# Patient Record
Sex: Female | Born: 1945
Health system: Southern US, Community
[De-identification: ages and names within clinical notes are randomized; demographics above are authoritative.]

## PROBLEM LIST (undated history)

## (undated) DIAGNOSIS — K219 Gastro-esophageal reflux disease without esophagitis: Secondary | ICD-10-CM

## (undated) DIAGNOSIS — C55 Malignant neoplasm of uterus, part unspecified: Secondary | ICD-10-CM

## (undated) DIAGNOSIS — I10 Essential (primary) hypertension: Secondary | ICD-10-CM

## (undated) DIAGNOSIS — E119 Type 2 diabetes mellitus without complications: Secondary | ICD-10-CM

## (undated) DIAGNOSIS — E7800 Pure hypercholesterolemia, unspecified: Secondary | ICD-10-CM

## (undated) DIAGNOSIS — I480 Paroxysmal atrial fibrillation: Secondary | ICD-10-CM

## (undated) HISTORY — PX: ABDOMINAL HYSTERECTOMY: SHX81

## (undated) HISTORY — DX: Type 2 diabetes mellitus without complications: E11.9

## (undated) HISTORY — DX: Paroxysmal atrial fibrillation: I48.0

## (undated) HISTORY — DX: Malignant neoplasm of uterus, part unspecified: C55

## (undated) HISTORY — PX: OTHER SURGICAL HISTORY: SHX169

---

## 2001-03-26 ENCOUNTER — Ambulatory Visit (HOSPITAL_COMMUNITY): Admission: RE | Admit: 2001-03-26 | Discharge: 2001-03-26 | Payer: Self-pay | Admitting: Internal Medicine

## 2001-03-26 ENCOUNTER — Encounter: Payer: Self-pay | Admitting: Obstetrics and Gynecology

## 2001-07-03 ENCOUNTER — Other Ambulatory Visit: Admission: RE | Admit: 2001-07-03 | Discharge: 2001-07-03 | Payer: Self-pay | Admitting: Obstetrics and Gynecology

## 2002-02-14 ENCOUNTER — Other Ambulatory Visit: Admission: RE | Admit: 2002-02-14 | Discharge: 2002-02-14 | Payer: Self-pay | Admitting: Dermatology

## 2002-03-29 ENCOUNTER — Ambulatory Visit (HOSPITAL_COMMUNITY): Admission: RE | Admit: 2002-03-29 | Discharge: 2002-03-29 | Payer: Self-pay | Admitting: Obstetrics and Gynecology

## 2002-03-29 ENCOUNTER — Encounter: Payer: Self-pay | Admitting: Obstetrics and Gynecology

## 2002-08-27 ENCOUNTER — Other Ambulatory Visit: Admission: RE | Admit: 2002-08-27 | Discharge: 2002-08-27 | Payer: Self-pay | Admitting: Obstetrics & Gynecology

## 2002-10-08 ENCOUNTER — Ambulatory Visit (HOSPITAL_COMMUNITY): Admission: RE | Admit: 2002-10-08 | Discharge: 2002-10-08 | Payer: Self-pay | Admitting: Obstetrics & Gynecology

## 2003-07-08 ENCOUNTER — Other Ambulatory Visit: Admission: RE | Admit: 2003-07-08 | Discharge: 2003-07-08 | Payer: Self-pay | Admitting: Obstetrics & Gynecology

## 2003-07-10 ENCOUNTER — Ambulatory Visit (HOSPITAL_COMMUNITY): Admission: RE | Admit: 2003-07-10 | Discharge: 2003-07-10 | Payer: Self-pay | Admitting: Internal Medicine

## 2003-10-11 HISTORY — PX: COLONOSCOPY: SHX174

## 2004-08-16 ENCOUNTER — Other Ambulatory Visit: Admission: RE | Admit: 2004-08-16 | Discharge: 2004-08-16 | Payer: Self-pay | Admitting: Obstetrics & Gynecology

## 2005-09-14 ENCOUNTER — Other Ambulatory Visit: Admission: RE | Admit: 2005-09-14 | Discharge: 2005-09-14 | Payer: Self-pay | Admitting: Obstetrics & Gynecology

## 2013-07-23 DIAGNOSIS — Z23 Encounter for immunization: Secondary | ICD-10-CM | POA: Diagnosis not present

## 2013-08-19 DIAGNOSIS — M1990 Unspecified osteoarthritis, unspecified site: Secondary | ICD-10-CM | POA: Diagnosis not present

## 2013-08-19 DIAGNOSIS — I10 Essential (primary) hypertension: Secondary | ICD-10-CM | POA: Diagnosis not present

## 2013-08-19 DIAGNOSIS — Z79899 Other long term (current) drug therapy: Secondary | ICD-10-CM | POA: Diagnosis not present

## 2013-08-19 DIAGNOSIS — K219 Gastro-esophageal reflux disease without esophagitis: Secondary | ICD-10-CM | POA: Diagnosis not present

## 2013-08-19 DIAGNOSIS — E119 Type 2 diabetes mellitus without complications: Secondary | ICD-10-CM | POA: Diagnosis not present

## 2013-08-27 DIAGNOSIS — Z0000 Encounter for general adult medical examination without abnormal findings: Secondary | ICD-10-CM | POA: Diagnosis not present

## 2013-08-27 DIAGNOSIS — E785 Hyperlipidemia, unspecified: Secondary | ICD-10-CM | POA: Diagnosis not present

## 2013-08-27 DIAGNOSIS — I10 Essential (primary) hypertension: Secondary | ICD-10-CM | POA: Diagnosis not present

## 2013-08-27 DIAGNOSIS — K219 Gastro-esophageal reflux disease without esophagitis: Secondary | ICD-10-CM | POA: Diagnosis not present

## 2013-08-27 DIAGNOSIS — Z23 Encounter for immunization: Secondary | ICD-10-CM | POA: Diagnosis not present

## 2013-08-27 DIAGNOSIS — E119 Type 2 diabetes mellitus without complications: Secondary | ICD-10-CM | POA: Diagnosis not present

## 2013-09-12 DIAGNOSIS — L820 Inflamed seborrheic keratosis: Secondary | ICD-10-CM | POA: Diagnosis not present

## 2013-09-12 DIAGNOSIS — D235 Other benign neoplasm of skin of trunk: Secondary | ICD-10-CM | POA: Diagnosis not present

## 2013-09-17 ENCOUNTER — Encounter (INDEPENDENT_AMBULATORY_CARE_PROVIDER_SITE_OTHER): Payer: Self-pay | Admitting: *Deleted

## 2013-09-26 ENCOUNTER — Encounter (INDEPENDENT_AMBULATORY_CARE_PROVIDER_SITE_OTHER): Payer: Self-pay | Admitting: *Deleted

## 2013-09-26 ENCOUNTER — Telehealth (INDEPENDENT_AMBULATORY_CARE_PROVIDER_SITE_OTHER): Payer: Self-pay | Admitting: *Deleted

## 2013-09-26 ENCOUNTER — Other Ambulatory Visit (INDEPENDENT_AMBULATORY_CARE_PROVIDER_SITE_OTHER): Payer: Self-pay | Admitting: *Deleted

## 2013-09-26 DIAGNOSIS — Z1211 Encounter for screening for malignant neoplasm of colon: Secondary | ICD-10-CM

## 2013-09-26 NOTE — Telephone Encounter (Signed)
Patient needs osmo pill prep 

## 2013-09-27 MED ORDER — SOD PHOS MONO-SOD PHOS DIBASIC 1.102-0.398 G PO TABS: ORAL_TABLET | ORAL | Status: DC

## 2013-10-29 ENCOUNTER — Telehealth (INDEPENDENT_AMBULATORY_CARE_PROVIDER_SITE_OTHER): Payer: Self-pay | Admitting: *Deleted

## 2013-10-29 NOTE — Telephone Encounter (Signed)
  Procedure: tcs  Reason/Indication:  screening  Has patient had this procedure before?  Yes, 2004  If so, when, by whom and where?    Is there a family history of colon cancer?  no  Who?  What age when diagnosed?    Is patient diabetic?   no      Does patient have prosthetic heart valve?  no  Do you have a pacemaker?  no  Has patient ever had endocarditis? no  Has patient had joint replacement within last 12 months?  no  Does patient tend to be constipated or take laxatives? no  Is patient on Coumadin, Plavix and/or Aspirin? yes  Medications: asa 81 mg daily, losartan/hctz 100/25 mg daily, amlodipine 5 mg daily, omeprazole 20 mg daily, pravastatin 20 mg daily  Allergies: nkda  Medication Adjustment: asa 2 days  Procedure date & time: 11/28/13 at 830

## 2013-10-29 NOTE — Telephone Encounter (Signed)
agree

## 2013-11-14 ENCOUNTER — Encounter (HOSPITAL_COMMUNITY): Payer: Self-pay | Admitting: Pharmacy Technician

## 2013-11-28 ENCOUNTER — Encounter (HOSPITAL_COMMUNITY): Payer: Self-pay | Admitting: *Deleted

## 2013-11-28 ENCOUNTER — Encounter (HOSPITAL_COMMUNITY)
Admission: RE | Disposition: A | Discharge status: Home / Self Care | Payer: Self-pay | Source: Ambulatory Visit | Attending: Internal Medicine

## 2013-11-28 ENCOUNTER — Ambulatory Visit (HOSPITAL_COMMUNITY)
Admission: RE | Admit: 2013-11-28 | Discharge: 2013-11-28 | Disposition: A | Discharge status: Home / Self Care | Payer: Medicare Other | Source: Ambulatory Visit | Attending: Internal Medicine | Admitting: Internal Medicine

## 2013-11-28 DIAGNOSIS — E7800 Pure hypercholesterolemia, unspecified: Secondary | ICD-10-CM | POA: Diagnosis not present

## 2013-11-28 DIAGNOSIS — Z79899 Other long term (current) drug therapy: Secondary | ICD-10-CM | POA: Insufficient documentation

## 2013-11-28 DIAGNOSIS — Z7982 Long term (current) use of aspirin: Secondary | ICD-10-CM | POA: Insufficient documentation

## 2013-11-28 DIAGNOSIS — I10 Essential (primary) hypertension: Secondary | ICD-10-CM | POA: Diagnosis not present

## 2013-11-28 DIAGNOSIS — K644 Residual hemorrhoidal skin tags: Secondary | ICD-10-CM | POA: Diagnosis not present

## 2013-11-28 DIAGNOSIS — Z1211 Encounter for screening for malignant neoplasm of colon: Secondary | ICD-10-CM

## 2013-11-28 HISTORY — DX: Essential (primary) hypertension: I10

## 2013-11-28 HISTORY — DX: Pure hypercholesterolemia, unspecified: E78.00

## 2013-11-28 HISTORY — PX: COLONOSCOPY: SHX5424

## 2013-11-28 HISTORY — DX: Gastro-esophageal reflux disease without esophagitis: K21.9

## 2013-11-28 SURGERY — COLONOSCOPY
Anesthesia: Moderate Sedation

## 2013-11-28 MED ORDER — MIDAZOLAM HCL 5 MG/5ML IJ SOLN
INTRAMUSCULAR | Status: AC
  Filled 2013-11-28: qty 10

## 2013-11-28 MED ORDER — MEPERIDINE HCL 50 MG/ML IJ SOLN
INTRAMUSCULAR | Status: DC | PRN
  Administered 2013-11-28 (×2): 25 mg via INTRAVENOUS

## 2013-11-28 MED ORDER — STERILE WATER FOR IRRIGATION IR SOLN
Status: DC | PRN
  Administered 2013-11-28: 09:00:00

## 2013-11-28 MED ORDER — MIDAZOLAM HCL 5 MG/5ML IJ SOLN
INTRAMUSCULAR | Status: DC | PRN
  Administered 2013-11-28 (×3): 2 mg via INTRAVENOUS

## 2013-11-28 MED ORDER — SODIUM CHLORIDE 0.9 % IV SOLN
INTRAVENOUS | Status: DC
  Administered 2013-11-28: 08:00:00 via INTRAVENOUS

## 2013-11-28 MED ORDER — MEPERIDINE HCL 50 MG/ML IJ SOLN
INTRAMUSCULAR | Status: AC
  Filled 2013-11-28: qty 1

## 2013-11-28 NOTE — Op Note (Signed)
COLONOSCOPY PROCEDURE REPORT  PATIENT:  Jennifer Morrison  MR#:  [id number] Birthdate:  1946-03-04, 68 y.o., female Endoscopist:  Dr. Rogene Houston, MD Referred By:  Dr. Asencion Noble, MD  Procedure Date: 11/28/2013  Procedure:   Colonoscopy  Indications:  Patient 68 year old Caucasian female was undergoing average risk screening colonoscopy.  Informed Consent:  The procedure and risks were reviewed with the patient and informed consent was obtained.  Medications:  Demerol 50 mg IV Versed 6 mg IV  Description of procedure:  After a digital rectal exam was performed, that colonoscope was advanced from the anus through the rectum and colon to the area of the cecum, ileocecal valve and appendiceal orifice. The cecum was deeply intubated. These structures were well-seen and photographed for the record. From the level of the cecum and ileocecal valve, the scope was slowly and cautiously withdrawn. The mucosal surfaces were carefully surveyed utilizing scope tip to flexion to facilitate fold flattening as needed. The scope was pulled down into the rectum where a thorough exam including retroflexion was performed.  Findings:   Prep satisfactory. Normal mucosa cecum and the rest of the colon. Normal mucosa of rectum except distal scar. Small hemorrhoids below the dentate.   Therapeutic/Diagnostic Maneuvers Performed:   None  Complications:  None  Cecal Withdrawal Time:  9 minutes  Impression:  Normal colonoscopy except small external hemorrhoids.  Recommendations:  Standard instructions given. Next screening exam in 10 years.  Susie Pousson U  11/28/2013 9:01 AM  CC: Dr. Asencion Noble, MD & Dr. Rayne Du ref. provider found

## 2013-11-28 NOTE — Discharge Instructions (Signed)
Resume usual medications and diet. °No driving for 24 hours. °Next screening exam in 10 years. ° °Colonoscopy, Care After °Refer to this sheet in the next few weeks. These instructions provide you with information on caring for yourself after your procedure. Your health care provider may also give you more specific instructions. Your treatment has been planned according to current medical practices, but problems sometimes occur. Call your health care provider if you have any problems or questions after your procedure. °WHAT TO EXPECT AFTER THE PROCEDURE  °After your procedure, it is typical to have the following: °· A small amount of blood in your stool. °· Moderate amounts of gas and mild abdominal cramping or bloating. °HOME CARE INSTRUCTIONS °· Do not drive, operate machinery, or sign important documents for 24 hours. °· You may shower and resume your regular physical activities, but move at a slower pace for the first 24 hours. °· Take frequent rest periods for the first 24 hours. °· Walk around or put a warm pack on your abdomen to help reduce abdominal cramping and bloating. °· Drink enough fluids to keep your urine clear or pale yellow. °· You may resume your normal diet as instructed by your health care provider. Avoid heavy or fried foods that are hard to digest. °· Avoid drinking alcohol for 24 hours or as instructed by your health care provider. °· Only take over-the-counter or prescription medicines as directed by your health care provider. °· If a tissue sample (biopsy) was taken during your procedure: °¨ Do not take aspirin or blood thinners for 7 days, or as instructed by your health care provider. °¨ Do not drink alcohol for 7 days, or as instructed by your health care provider. °¨ Eat soft foods for the first 24 hours. °SEEK MEDICAL CARE IF: °You have persistent spotting of blood in your stool 2-3 days after the procedure. °SEEK IMMEDIATE MEDICAL CARE IF: °· You have more than a small spotting of  blood in your stool. °· You pass large blood clots in your stool. °· Your abdomen is swollen (distended). °· You have nausea or vomiting. °· You have a fever. °· You have increasing abdominal pain that is not relieved with medicine. ° °

## 2013-11-28 NOTE — H&P (Signed)
Jennifer Morrison is an 68 y.o. female.   Chief Complaint: Patient is here for colonoscopy. HPI: Patient is 68 year old Caucasian female presents for screening colonoscopy. She denies abdominal pain change in bowel habits or rectal bleeding.  Last colonoscopy was in 2004. Family history is negative for CRC to  Past Medical History  Diagnosis Date  . Hypertension   . Hypercholesteremia   . GERD (gastroesophageal reflux disease)     Past Surgical History  Procedure Laterality Date  . Polyp removed from uterus    . Colonoscopy  2005    Family History  Problem Relation Age of Onset  . Colon cancer Neg Hx    Social History:  reports that she has never smoked. She does not have any smokeless tobacco history on file. She reports that she does not drink alcohol or use illicit drugs.  Allergies: No Known Allergies  Medications Prior to Admission  Medication Sig Dispense Refill  . amLODipine (NORVASC) 5 MG tablet Take 5 mg by mouth daily.      Marland Kitchen aspirin EC 81 MG tablet Take 81 mg by mouth every evening.      . Cholecalciferol (VITAMIN D PO) Take 1 tablet by mouth daily.      Marland Kitchen losartan-hydrochlorothiazide (HYZAAR) 100-25 MG per tablet Take 1 tablet by mouth daily.      Marland Kitchen omeprazole (PRILOSEC) 20 MG capsule Take 20 mg by mouth daily.      . pravastatin (PRAVACHOL) 20 MG tablet Take 20 mg by mouth daily.      . sodium phosphates (OSMOPREP) 1.102-0.398 G TABS At 4:00 pm take 20 tablets and at 7:00 pm take remaining tablets  32 tablet  0    No results found for this or any previous visit (from the past 48 hour(s)). No results found.  ROS  Blood pressure 147/69, pulse 88, temperature 97.9 F (36.6 C), temperature source Oral, resp. rate 19, height 5\' 3"  (1.6 m), weight 178 lb (80.74 kg), SpO2 100.00%. Physical Exam  Constitutional: She appears well-developed and well-nourished.  HENT:  Mouth/Throat: Oropharynx is clear and moist. No oropharyngeal exudate.  Eyes: Conjunctivae are  normal. No scleral icterus.  Neck: No thyromegaly present.  Cardiovascular: Normal rate, regular rhythm and normal heart sounds.   No murmur heard. Respiratory: Effort normal and breath sounds normal.  GI: Soft. She exhibits no distension and no mass. There is no tenderness.  Musculoskeletal: She exhibits no edema.  Lymphadenopathy:    She has no cervical adenopathy.  Neurological: She is alert.  Skin: Skin is warm and dry.     Assessment/Plan Average risk screening colonoscopy.  REHMAN,NAJEEB U 11/28/2013, 8:36 AM

## 2013-12-02 ENCOUNTER — Encounter (HOSPITAL_COMMUNITY): Payer: Self-pay | Admitting: Internal Medicine

## 2014-02-19 DIAGNOSIS — Z79899 Other long term (current) drug therapy: Secondary | ICD-10-CM | POA: Diagnosis not present

## 2014-02-19 DIAGNOSIS — E119 Type 2 diabetes mellitus without complications: Secondary | ICD-10-CM | POA: Diagnosis not present

## 2014-02-19 DIAGNOSIS — K219 Gastro-esophageal reflux disease without esophagitis: Secondary | ICD-10-CM | POA: Diagnosis not present

## 2014-02-19 DIAGNOSIS — M1990 Unspecified osteoarthritis, unspecified site: Secondary | ICD-10-CM | POA: Diagnosis not present

## 2014-02-19 DIAGNOSIS — I10 Essential (primary) hypertension: Secondary | ICD-10-CM | POA: Diagnosis not present

## 2014-02-28 DIAGNOSIS — C061 Malignant neoplasm of vestibule of mouth: Secondary | ICD-10-CM | POA: Diagnosis not present

## 2014-02-28 DIAGNOSIS — E785 Hyperlipidemia, unspecified: Secondary | ICD-10-CM | POA: Diagnosis not present

## 2014-02-28 DIAGNOSIS — E119 Type 2 diabetes mellitus without complications: Secondary | ICD-10-CM | POA: Diagnosis not present

## 2014-06-17 DIAGNOSIS — Z124 Encounter for screening for malignant neoplasm of cervix: Secondary | ICD-10-CM | POA: Diagnosis not present

## 2014-06-17 DIAGNOSIS — Z1231 Encounter for screening mammogram for malignant neoplasm of breast: Secondary | ICD-10-CM | POA: Diagnosis not present

## 2014-06-17 DIAGNOSIS — M949 Disorder of cartilage, unspecified: Secondary | ICD-10-CM | POA: Diagnosis not present

## 2014-06-17 DIAGNOSIS — N959 Unspecified menopausal and perimenopausal disorder: Secondary | ICD-10-CM | POA: Diagnosis not present

## 2014-06-17 DIAGNOSIS — M899 Disorder of bone, unspecified: Secondary | ICD-10-CM | POA: Diagnosis not present

## 2014-07-28 DIAGNOSIS — Z23 Encounter for immunization: Secondary | ICD-10-CM | POA: Diagnosis not present

## 2014-08-22 DIAGNOSIS — Z79899 Other long term (current) drug therapy: Secondary | ICD-10-CM | POA: Diagnosis not present

## 2014-08-22 DIAGNOSIS — K219 Gastro-esophageal reflux disease without esophagitis: Secondary | ICD-10-CM | POA: Diagnosis not present

## 2014-08-22 DIAGNOSIS — M899 Disorder of bone, unspecified: Secondary | ICD-10-CM | POA: Diagnosis not present

## 2014-08-22 DIAGNOSIS — I517 Cardiomegaly: Secondary | ICD-10-CM | POA: Diagnosis not present

## 2014-08-22 DIAGNOSIS — I10 Essential (primary) hypertension: Secondary | ICD-10-CM | POA: Diagnosis not present

## 2014-08-22 DIAGNOSIS — E119 Type 2 diabetes mellitus without complications: Secondary | ICD-10-CM | POA: Diagnosis not present

## 2014-08-29 DIAGNOSIS — Z0001 Encounter for general adult medical examination with abnormal findings: Secondary | ICD-10-CM | POA: Diagnosis not present

## 2014-09-22 ENCOUNTER — Other Ambulatory Visit (HOSPITAL_COMMUNITY): Payer: Self-pay | Admitting: Internal Medicine

## 2014-09-22 ENCOUNTER — Ambulatory Visit (HOSPITAL_COMMUNITY)
Admission: RE | Admit: 2014-09-22 | Discharge: 2014-09-22 | Disposition: A | Discharge status: Home / Self Care | Payer: Medicare Other | Source: Ambulatory Visit | Attending: Internal Medicine | Admitting: Internal Medicine

## 2014-09-22 DIAGNOSIS — M25572 Pain in left ankle and joints of left foot: Secondary | ICD-10-CM | POA: Diagnosis not present

## 2014-09-22 DIAGNOSIS — S99912A Unspecified injury of left ankle, initial encounter: Secondary | ICD-10-CM | POA: Diagnosis not present

## 2014-09-22 DIAGNOSIS — W109XXA Fall (on) (from) unspecified stairs and steps, initial encounter: Secondary | ICD-10-CM | POA: Diagnosis not present

## 2014-09-22 DIAGNOSIS — S93422A Sprain of deltoid ligament of left ankle, initial encounter: Secondary | ICD-10-CM | POA: Diagnosis not present

## 2014-09-22 DIAGNOSIS — M7989 Other specified soft tissue disorders: Secondary | ICD-10-CM | POA: Diagnosis not present

## 2014-09-22 DIAGNOSIS — M7662 Achilles tendinitis, left leg: Secondary | ICD-10-CM | POA: Diagnosis not present

## 2014-09-22 DIAGNOSIS — M7732 Calcaneal spur, left foot: Secondary | ICD-10-CM | POA: Insufficient documentation

## 2014-12-02 DIAGNOSIS — H524 Presbyopia: Secondary | ICD-10-CM | POA: Diagnosis not present

## 2014-12-02 DIAGNOSIS — H2513 Age-related nuclear cataract, bilateral: Secondary | ICD-10-CM | POA: Diagnosis not present

## 2014-12-02 DIAGNOSIS — H52203 Unspecified astigmatism, bilateral: Secondary | ICD-10-CM | POA: Diagnosis not present

## 2015-03-12 DIAGNOSIS — Z79899 Other long term (current) drug therapy: Secondary | ICD-10-CM | POA: Diagnosis not present

## 2015-03-12 DIAGNOSIS — E119 Type 2 diabetes mellitus without complications: Secondary | ICD-10-CM | POA: Diagnosis not present

## 2015-03-19 DIAGNOSIS — E119 Type 2 diabetes mellitus without complications: Secondary | ICD-10-CM | POA: Diagnosis not present

## 2015-03-19 DIAGNOSIS — I10 Essential (primary) hypertension: Secondary | ICD-10-CM | POA: Diagnosis not present

## 2015-06-22 DIAGNOSIS — Z1231 Encounter for screening mammogram for malignant neoplasm of breast: Secondary | ICD-10-CM | POA: Diagnosis not present

## 2015-06-22 DIAGNOSIS — Z6835 Body mass index (BMI) 35.0-35.9, adult: Secondary | ICD-10-CM | POA: Diagnosis not present

## 2015-06-22 DIAGNOSIS — Z01419 Encounter for gynecological examination (general) (routine) without abnormal findings: Secondary | ICD-10-CM | POA: Diagnosis not present

## 2015-07-03 DIAGNOSIS — E119 Type 2 diabetes mellitus without complications: Secondary | ICD-10-CM | POA: Diagnosis not present

## 2015-07-10 DIAGNOSIS — Z23 Encounter for immunization: Secondary | ICD-10-CM | POA: Diagnosis not present

## 2015-07-10 DIAGNOSIS — Z6836 Body mass index (BMI) 36.0-36.9, adult: Secondary | ICD-10-CM | POA: Diagnosis not present

## 2015-07-10 DIAGNOSIS — I10 Essential (primary) hypertension: Secondary | ICD-10-CM | POA: Diagnosis not present

## 2015-07-10 DIAGNOSIS — E119 Type 2 diabetes mellitus without complications: Secondary | ICD-10-CM | POA: Diagnosis not present

## 2015-08-31 DIAGNOSIS — K219 Gastro-esophageal reflux disease without esophagitis: Secondary | ICD-10-CM | POA: Diagnosis not present

## 2015-08-31 DIAGNOSIS — Z79899 Other long term (current) drug therapy: Secondary | ICD-10-CM | POA: Diagnosis not present

## 2015-08-31 DIAGNOSIS — E119 Type 2 diabetes mellitus without complications: Secondary | ICD-10-CM | POA: Diagnosis not present

## 2015-08-31 DIAGNOSIS — I10 Essential (primary) hypertension: Secondary | ICD-10-CM | POA: Diagnosis not present

## 2015-09-01 DIAGNOSIS — Z6834 Body mass index (BMI) 34.0-34.9, adult: Secondary | ICD-10-CM | POA: Diagnosis not present

## 2015-09-01 DIAGNOSIS — R21 Rash and other nonspecific skin eruption: Secondary | ICD-10-CM | POA: Diagnosis not present

## 2015-09-08 DIAGNOSIS — Z0001 Encounter for general adult medical examination with abnormal findings: Secondary | ICD-10-CM | POA: Diagnosis not present

## 2015-09-08 DIAGNOSIS — I10 Essential (primary) hypertension: Secondary | ICD-10-CM | POA: Diagnosis not present

## 2015-09-08 DIAGNOSIS — E119 Type 2 diabetes mellitus without complications: Secondary | ICD-10-CM | POA: Diagnosis not present

## 2015-09-08 DIAGNOSIS — N183 Chronic kidney disease, stage 3 (moderate): Secondary | ICD-10-CM | POA: Diagnosis not present

## 2015-09-28 DIAGNOSIS — L309 Dermatitis, unspecified: Secondary | ICD-10-CM | POA: Diagnosis not present

## 2015-12-15 DIAGNOSIS — H25043 Posterior subcapsular polar age-related cataract, bilateral: Secondary | ICD-10-CM | POA: Diagnosis not present

## 2015-12-15 DIAGNOSIS — E119 Type 2 diabetes mellitus without complications: Secondary | ICD-10-CM | POA: Diagnosis not present

## 2015-12-15 DIAGNOSIS — H2513 Age-related nuclear cataract, bilateral: Secondary | ICD-10-CM | POA: Diagnosis not present

## 2015-12-15 DIAGNOSIS — H53021 Refractive amblyopia, right eye: Secondary | ICD-10-CM | POA: Diagnosis not present

## 2015-12-15 DIAGNOSIS — D3131 Benign neoplasm of right choroid: Secondary | ICD-10-CM | POA: Diagnosis not present

## 2016-01-04 DIAGNOSIS — E119 Type 2 diabetes mellitus without complications: Secondary | ICD-10-CM | POA: Diagnosis not present

## 2016-01-04 DIAGNOSIS — Z79899 Other long term (current) drug therapy: Secondary | ICD-10-CM | POA: Diagnosis not present

## 2016-01-11 DIAGNOSIS — E119 Type 2 diabetes mellitus without complications: Secondary | ICD-10-CM | POA: Diagnosis not present

## 2016-01-11 DIAGNOSIS — K219 Gastro-esophageal reflux disease without esophagitis: Secondary | ICD-10-CM | POA: Diagnosis not present

## 2016-01-11 DIAGNOSIS — I10 Essential (primary) hypertension: Secondary | ICD-10-CM | POA: Diagnosis not present

## 2016-05-09 DIAGNOSIS — E119 Type 2 diabetes mellitus without complications: Secondary | ICD-10-CM | POA: Diagnosis not present

## 2016-05-17 DIAGNOSIS — E119 Type 2 diabetes mellitus without complications: Secondary | ICD-10-CM | POA: Diagnosis not present

## 2016-05-17 DIAGNOSIS — I10 Essential (primary) hypertension: Secondary | ICD-10-CM | POA: Diagnosis not present

## 2016-07-14 DIAGNOSIS — N958 Other specified menopausal and perimenopausal disorders: Secondary | ICD-10-CM | POA: Diagnosis not present

## 2016-07-14 DIAGNOSIS — Z1231 Encounter for screening mammogram for malignant neoplasm of breast: Secondary | ICD-10-CM | POA: Diagnosis not present

## 2016-07-14 DIAGNOSIS — Z124 Encounter for screening for malignant neoplasm of cervix: Secondary | ICD-10-CM | POA: Diagnosis not present

## 2016-07-14 DIAGNOSIS — N950 Postmenopausal bleeding: Secondary | ICD-10-CM | POA: Diagnosis not present

## 2016-07-14 DIAGNOSIS — Z01419 Encounter for gynecological examination (general) (routine) without abnormal findings: Secondary | ICD-10-CM | POA: Diagnosis not present

## 2016-07-26 DIAGNOSIS — Z23 Encounter for immunization: Secondary | ICD-10-CM | POA: Diagnosis not present

## 2016-08-03 ENCOUNTER — Other Ambulatory Visit: Payer: Self-pay | Admitting: Obstetrics & Gynecology

## 2016-08-03 DIAGNOSIS — N950 Postmenopausal bleeding: Secondary | ICD-10-CM | POA: Diagnosis not present

## 2016-08-03 DIAGNOSIS — N840 Polyp of corpus uteri: Secondary | ICD-10-CM | POA: Diagnosis not present

## 2016-08-03 DIAGNOSIS — C541 Malignant neoplasm of endometrium: Secondary | ICD-10-CM | POA: Diagnosis not present

## 2016-08-14 DIAGNOSIS — C55 Malignant neoplasm of uterus, part unspecified: Secondary | ICD-10-CM

## 2016-08-14 HISTORY — DX: Malignant neoplasm of uterus, part unspecified: C55

## 2016-08-15 ENCOUNTER — Encounter: Payer: Self-pay | Admitting: Gynecologic Oncology

## 2016-08-15 ENCOUNTER — Ambulatory Visit: Payer: Medicare Other | Attending: Gynecologic Oncology | Admitting: Gynecologic Oncology

## 2016-08-15 DIAGNOSIS — K219 Gastro-esophageal reflux disease without esophagitis: Secondary | ICD-10-CM | POA: Insufficient documentation

## 2016-08-15 DIAGNOSIS — Z7984 Long term (current) use of oral hypoglycemic drugs: Secondary | ICD-10-CM | POA: Insufficient documentation

## 2016-08-15 DIAGNOSIS — E7800 Pure hypercholesterolemia, unspecified: Secondary | ICD-10-CM | POA: Insufficient documentation

## 2016-08-15 DIAGNOSIS — E669 Obesity, unspecified: Secondary | ICD-10-CM | POA: Diagnosis not present

## 2016-08-15 DIAGNOSIS — E119 Type 2 diabetes mellitus without complications: Secondary | ICD-10-CM | POA: Diagnosis not present

## 2016-08-15 DIAGNOSIS — I10 Essential (primary) hypertension: Secondary | ICD-10-CM | POA: Diagnosis not present

## 2016-08-15 DIAGNOSIS — Z7982 Long term (current) use of aspirin: Secondary | ICD-10-CM | POA: Insufficient documentation

## 2016-08-15 DIAGNOSIS — Z6832 Body mass index (BMI) 32.0-32.9, adult: Secondary | ICD-10-CM | POA: Diagnosis not present

## 2016-08-15 DIAGNOSIS — Z79899 Other long term (current) drug therapy: Secondary | ICD-10-CM | POA: Insufficient documentation

## 2016-08-15 DIAGNOSIS — C541 Malignant neoplasm of endometrium: Secondary | ICD-10-CM

## 2016-08-15 NOTE — Patient Instructions (Signed)
Preparing for your Surgery  Plan for surgery on September 06, 2016 with Dr. Everitt Amber at [place] will be scheduled for a robotic assisted total hysterectomy, bilateral salpingo-oophorectomy, sentinel lymph node mapping/biopsy.  Pre-operative Testing -You will receive a phone call from presurgical testing at [facility] to arrange for a pre-operative testing appointment before your surgery.  This appointment normally occurs one to two weeks before your scheduled surgery.   -Bring your insurance card, copy of an advanced directive if applicable, medication list  -At that visit, you will be asked to sign a consent for a possible blood transfusion in case a transfusion becomes necessary during surgery.  The need for a blood transfusion is rare but having consent is a necessary part of your care.     -DO NOT RESUME TAKING YOUR ASPIRIN.  You should not be taking blood thinners or aspirin at least ten days prior to surgery unless instructed by your surgeon.  Day Before Surgery at [place] will be asked to take in a light diet the day before surgery.  Avoid carbonated beverages.  You will be advised to have nothing to eat or drink after midnight the evening before.     Eat a light diet the day before surgery.  Examples including soups, broths, toast, yogurt, mashed potatoes.  Things to avoid include carbonated beverages (fizzy beverages), raw fruits and raw vegetables, or beans.    If your bowels are filled with gas, your surgeon will have difficulty visualizing your pelvic organs which increases your surgical risks.  Your role in recovery Your role is to become active as soon as directed by your doctor, while still giving yourself time to heal.  Rest when you feel tired. You will be asked to do the following in order to speed your recovery:  - Cough and breathe deeply. This helps toclear and expand your lungs and can prevent pneumonia. You may be given a  spirometer to practice deep breathing. A staff member will show you how to use the spirometer. - Do mild physical activity. Walking or moving your legs help your circulation and body functions return to normal. A staff member will help you when you try to walk and will provide you with simple exercises. Do not try to get up or walk alone the first time. - Actively manage your pain. Managing your pain lets you move in comfort. We will ask you to rate your pain on a scale of zero to 10. It is your responsibility to tell your doctor or nurse where and how much you hurt so your pain can be treated.  Special Considerations -If you are diabetic, you may be placed on insulin after surgery to have closer control over your blood sugars to promote healing and recovery.  This does not mean that you will be discharged on insulin.  If applicable, your oral antidiabetics will be resumed when you are tolerating a solid diet.  -Your final pathology results from surgery should be available by the Friday after surgery and the results will be relayed to you when available.   Blood Transfusion Information WHAT IS A BLOOD TRANSFUSION? A transfusion is the replacement of blood or some of its parts. Blood is made up of multiple cells which provide different functions.  Red blood cells carry oxygen and are used for blood loss replacement.  White blood cells fight against infection.  Platelets control bleeding.  Plasma helps clot blood.  Other blood products are  available for specialized needs, such as hemophilia or other clotting disorders. BEFORE THE TRANSFUSION  Who gives blood for transfusions?   You may be able to donate blood to be used at a later date on yourself (autologous donation).  Relatives can be asked to donate blood. This is generally not any safer than if you have received blood from a stranger. The same precautions are taken to ensure safety when a relative's blood is donated.  Healthy  volunteers who are fully evaluated to make sure their blood is safe. This is blood bank blood. Transfusion therapy is the safest it has ever been in the practice of medicine. Before blood is taken from a donor, a complete history is taken to make sure that person has no history of diseases nor engages in risky social behavior (examples are intravenous drug use or sexual activity with multiple partners). The donor's travel history is screened to minimize risk of transmitting infections, such as malaria. The donated blood is tested for signs of infectious diseases, such as HIV and hepatitis. The blood is then tested to be sure it is compatible with you in order to minimize the chance of a transfusion reaction. If you or a relative donates blood, this is often done in anticipation of surgery and is not appropriate for emergency situations. It takes many days to process the donated blood. RISKS AND COMPLICATIONS Although transfusion therapy is very safe and saves many lives, the main dangers of transfusion include:   Getting an infectious disease.  Developing a transfusion reaction. This is an allergic reaction to something in the blood you were given. Every precaution is taken to prevent this. The decision to have a blood transfusion has been considered carefully by your caregiver before blood is given. Blood is not given unless the benefits outweigh the risks.

## 2016-08-15 NOTE — Progress Notes (Signed)
Consult Note: Gyn-Onc  Consult was requested by Dr. Nori Riis for the evaluation of Jennifer Morrison 70 y.o. female  CC:  Chief Complaint  Patient presents with  . uterine cancer    New consultation    Assessment/Plan:  [name]  is a 70 y.o.  year old with endoemetrial cancer.  I discussed the nature of the disease and potential options for treatment including surgery with hysterectomy vs medical therapy with progestins. I recommended surgery as the most definitive treatment as she is post-menopausal and in general reasonable health.  She is electing for robotic assisted hysterectomy, BSO and SLN biopsy. I discussed that SLN's will be mapped and frozen section sent to evaluate for malignancy if there is unsuccessful mapping.  I discussed operative risks including  bleeding, infection, damage to internal organs (such as bladder,ureters, bowels), blood clot, reoperation and rehospitalization. I discussed anticipated recovery and restrictions.  She was offered a date on 21st or 28th and elected for the 28th.  The patient will continue to hold her ASA preoperatively.   HPI: Jennifer Morrison is a 70 year old G2P2 who is seen in consultation at the request of Dr Nori Riis for endometrial cancer.    The patient reports a single episode of postmenopausal bleeding in September, 2017. Dr Nori Riis performed an [place] on 07/14/16 which showed a 5.6x3.1x3.8cm uterus with a 1.4cm endometrial stripe (no adnexal masses). A hysteroscopy and D&C was performed on 08/03/16 which revealed endometrial adenocarcinoma associated with CAH (FIGO grade 1).  The patient is obese and has DM (HbA1C 6%) on metformin. The patient has HTN.   Current Meds:  Outpatient Encounter Prescriptions as of 08/15/2016  Medication Sig  . ACCU-CHEK AVIVA PLUS test strip USE AS DIRECTED EVERY DAY  . ACCU-CHEK FASTCLIX LANCETS MISC USE AS DIRECTED EVERY DAY  . amLODipine (NORVASC) 5 MG tablet Take 5 mg by mouth daily.  Marland Kitchen aspirin EC 81 MG  tablet Take 81 mg by mouth every evening.  . Cholecalciferol (VITAMIN D PO) Take 1 tablet by mouth daily.  Marland Kitchen losartan-hydrochlorothiazide (HYZAAR) 100-25 MG per tablet Take 1 tablet by mouth daily.  . metFORMIN (GLUCOPHAGE) 500 MG tablet   . omeprazole (PRILOSEC) 20 MG capsule Take 20 mg by mouth daily.  . Potassium 99 MG TABS Take 1 tablet by mouth.  . pravastatin (PRAVACHOL) 20 MG tablet Take 20 mg by mouth daily.   No facility-administered encounter medications on file as of 08/15/2016.     Allergy: No Known Allergies  Social Hx:   Social History   Social History  . Marital status: Married    Spouse name: N/A  . Number of children: N/A  . Years of education: N/A   Occupational History  . Not on file.   Social History Main Topics  . Smoking status: Never Smoker  . Smokeless tobacco: Never Used  . Alcohol use No  . Drug use: No  . Sexual activity: Not on file   Other Topics Concern  . Not on file   Social History Narrative  . No narrative on file    Past Surgical Hx:  Past Surgical History:  Procedure Laterality Date  . COLONOSCOPY  2005  . COLONOSCOPY N/A 11/28/2013   Procedure: COLONOSCOPY;  Surgeon: Rogene Houston, MD;  Location: [facility];  Service: Endoscopy;  Laterality: N/A;  830  . Polyp removed from uterus      Past Medical Hx:  Past Medical History:  Diagnosis Date  .  Diabetes mellitus without complication ([place])   . GERD (gastroesophageal reflux disease)   . Hypercholesteremia   . Hypertension   . Uterine cancer ([place]) 08/14/2016    Past Gynecological History:  G2P2 SVD x 2 No LMP recorded. Patient is postmenopausal.  Family Hx:  Family History  Problem Relation Age of Onset  . Diabetes Father   . Diabetes Brother   . Colon cancer Neg Hx     Review of Systems:  Constitutional  Feels well,    ENT Normal appearing ears and nares bilaterally Skin/Breast  No rash, sores, jaundice, itching, dryness Cardiovascular  No chest pain,  shortness of breath, or edema  Pulmonary  No cough or wheeze.  Gastro Intestinal  No nausea, vomitting, or diarrhoea. No bright red blood per rectum, no abdominal pain, change in bowel movement, or constipation.  Genito Urinary  No frequency, urgency, dysuria, + postmenopausal bleeding Musculo Skeletal  No myalgia, arthralgia, joint swelling or pain  Neurologic  No weakness, numbness, change in gait,  Psychology  No depression, anxiety, insomnia.   Vitals:  Blood pressure (!) 176/77, pulse 93, temperature 97.8 F (36.6 C), temperature source Oral, resp. rate 18, weight 183 lb 9.6 oz (83.3 kg), SpO2 98 %.  Physical Exam: WD in NAD Neck  Supple NROM, without any enlargements.  Lymph Node Survey No cervical supraclavicular or inguinal adenopathy Cardiovascular  Pulse normal rate, regularity and rhythm. S1 and S2 normal.  Lungs  Clear to auscultation bilateraly, without wheezes/crackles/rhonchi. Good air movement.  Skin  No rash/lesions/breakdown  Psychiatry  Alert and oriented to person, place, and time  Abdomen  Normoactive bowel sounds, abdomen soft, non-tender and obese without evidence of hernia.  Back No CVA tenderness Genito Urinary  Vulva/vagina: Normal external female genitalia.   No lesions. No discharge or bleeding.  Bladder/urethra:  No lesions or masses, well supported bladder  Vagina: normal  Cervix: Normal appearing, no lesions.  Uterus:  Small, mobile, no parametrial involvement or nodularity.  Adnexa: no palpable masses. Rectal  deferred Extremities  No bilateral cyanosis, clubbing or edema.   [name], MD  08/15/2016, 9:32 AM

## 2016-08-16 NOTE — Progress Notes (Signed)
Pt is being scheduled for preop appt; please place surgical orders in epic. Thanks.  

## 2016-08-30 ENCOUNTER — Ambulatory Visit (HOSPITAL_COMMUNITY)
Admission: RE | Admit: 2016-08-30 | Discharge: 2016-08-30 | Disposition: A | Discharge status: Home / Self Care | Payer: Medicare Other | Source: Ambulatory Visit | Attending: Gynecologic Oncology | Admitting: Gynecologic Oncology

## 2016-08-30 ENCOUNTER — Encounter (HOSPITAL_COMMUNITY)
Admission: RE | Admit: 2016-08-30 | Discharge: 2016-08-30 | Disposition: A | Discharge status: Home / Self Care | Payer: Medicare Other | Source: Ambulatory Visit | Attending: Gynecologic Oncology | Admitting: Gynecologic Oncology

## 2016-08-30 ENCOUNTER — Encounter (HOSPITAL_COMMUNITY): Payer: Self-pay

## 2016-08-30 DIAGNOSIS — K219 Gastro-esophageal reflux disease without esophagitis: Secondary | ICD-10-CM | POA: Diagnosis not present

## 2016-08-30 DIAGNOSIS — C55 Malignant neoplasm of uterus, part unspecified: Secondary | ICD-10-CM | POA: Insufficient documentation

## 2016-08-30 DIAGNOSIS — Z7984 Long term (current) use of oral hypoglycemic drugs: Secondary | ICD-10-CM | POA: Insufficient documentation

## 2016-08-30 DIAGNOSIS — Z01818 Encounter for other preprocedural examination: Secondary | ICD-10-CM | POA: Insufficient documentation

## 2016-08-30 DIAGNOSIS — I10 Essential (primary) hypertension: Secondary | ICD-10-CM | POA: Diagnosis not present

## 2016-08-30 DIAGNOSIS — Z01812 Encounter for preprocedural laboratory examination: Secondary | ICD-10-CM | POA: Diagnosis not present

## 2016-08-30 DIAGNOSIS — E119 Type 2 diabetes mellitus without complications: Secondary | ICD-10-CM | POA: Diagnosis not present

## 2016-08-30 LAB — COMPREHENSIVE METABOLIC PANEL
ALT: 16 U/L (ref 14–54)
ANION GAP: 7 (ref 5–15)
AST: 18 U/L (ref 15–41)
Albumin: 4.2 g/dL (ref 3.5–5.0)
Alkaline Phosphatase: 43 U/L (ref 38–126)
BILIRUBIN TOTAL: 0.4 mg/dL (ref 0.3–1.2)
BUN: 19 mg/dL (ref 6–20)
CALCIUM: 9.6 mg/dL (ref 8.9–10.3)
CO2: 26 mmol/L (ref 22–32)
Chloride: 106 mmol/L (ref 101–111)
Creatinine, Ser: 1.26 mg/dL — ABNORMAL HIGH (ref 0.44–1.00)
GFR, EST AFRICAN AMERICAN: 50 mL/min — AB (ref >60)
GFR, EST NON AFRICAN AMERICAN: 43 mL/min — AB (ref >60)
Glucose, Bld: 115 mg/dL — ABNORMAL HIGH (ref 65–99)
POTASSIUM: 5.1 mmol/L (ref 3.5–5.1)
Sodium: 139 mmol/L (ref 135–145)
TOTAL PROTEIN: 8 g/dL (ref 6.5–8.1)

## 2016-08-30 LAB — CBC WITH DIFFERENTIAL/PLATELET
BASOS PCT: 0 %
Basophils Absolute: 0 10*3/uL (ref 0.0–0.1)
Eosinophils Absolute: 0.2 10*3/uL (ref 0.0–0.7)
Eosinophils Relative: 1 %
HEMATOCRIT: 40 % (ref 36.0–46.0)
Hemoglobin: 13.7 g/dL (ref 12.0–15.0)
LYMPHS ABS: 2.2 10*3/uL (ref 0.7–4.0)
LYMPHS PCT: 19 %
MCH: 31 pg (ref 26.0–34.0)
MCHC: 34.3 g/dL (ref 30.0–36.0)
MCV: 90.5 fL (ref 78.0–100.0)
MONO ABS: 0.8 10*3/uL (ref 0.1–1.0)
MONOS PCT: 7 %
NEUTROS ABS: 8.6 10*3/uL — AB (ref 1.7–7.7)
Neutrophils Relative %: 73 %
Platelets: 385 10*3/uL (ref 150–400)
RBC: 4.42 MIL/uL (ref 3.87–5.11)
RDW: 12.8 % (ref 11.5–15.5)
WBC: 11.8 10*3/uL — ABNORMAL HIGH (ref 4.0–10.5)

## 2016-08-30 LAB — ABO/RH: ABO/RH(D): A NEG

## 2016-08-30 LAB — URINALYSIS, ROUTINE W REFLEX MICROSCOPIC
Bilirubin Urine: NEGATIVE
GLUCOSE, UA: NEGATIVE mg/dL
HGB URINE DIPSTICK: NEGATIVE
KETONES UR: NEGATIVE mg/dL
Leukocytes, UA: NEGATIVE
Nitrite: NEGATIVE
PH: 5.5 (ref 5.0–8.0)
PROTEIN: NEGATIVE mg/dL
Specific Gravity, Urine: 1.009 (ref 1.005–1.030)

## 2016-08-30 LAB — GLUCOSE, CAPILLARY: Glucose-Capillary: 104 mg/dL — ABNORMAL HIGH (ref 65–99)

## 2016-08-30 NOTE — Pre-Procedure Instructions (Signed)
EKG 09-08-15, LOV notes- Dr. Willey Blade with chart [phone]. CXR done today per MD order. CBG =104 @1105  AM.

## 2016-08-30 NOTE — Progress Notes (Signed)
08-30-16 1525 Note labs -CMP-"Creatinine" -viewable in Epic.

## 2016-08-30 NOTE — Patient Instructions (Addendum)
Jennifer Morrison  08/30/2016   Your procedure is scheduled on: 09-06-16  Report to [facility] Main  Entrance take [facility]  elevators to 3rd floor to  [place] at  [place] AM.  Call this number if you have problems the morning of surgery [phone]  Eat a light diet the day before surgery. Examples including soups, broths, toast, yogurt, mashed potatoes. Things to avoid include carbonated beverages (fizzy beverages), raw fruits and raw vegetables, or beans. (Drink clear Liquids plentiful day before surgery).  If your bowels are filled with gas, your surgeon will have difficulty visualizing your pelvic organs which increases your surgical risks.   CLEAR LIQUID DIET   Foods Allowed                                                                      Coffee and tea, regular and decaf                              Plain Jell-O in any flavor                                              Fruit ices (not with fruit pulp)                                      Iced Popsicles                                                                      Cranberry, grape and apple juices Sports drinks like Gatorade Lightly seasoned clear broth or consume(fat free) Sugar, honey syrup     Remember: ONLY 1 PERSON MAY GO WITH YOU TO SHORT STAY TO GET  READY MORNING OF YOUR SURGERY.  Do not eat food or drink liquids :After Midnight.     Take these medicines the morning of surgery with A SIP OF WATER: Amlodipine. Prilosec. DO NOT TAKE ANY DIABETIC MEDICATIONS DAY OF YOUR SURGERY                               You may not have any metal on your body including hair pins and              piercings  Do not wear jewelry, make-up, lotions, powders or perfumes, deodorant             Do not wear nail polish.  Do not shave  48 hours prior to surgery.              Men may shave face and neck.   Do  not bring valuables to the hospital. [place].  Contacts, dentures or bridgework may not be worn into surgery.  Leave suitcase in the car. After surgery it may be brought to your room.     Patients discharged the day of surgery will not be allowed to drive home.  Name and phone number of your driver:  Special Instructions: N/A              Please read over the following fact sheets you were given: _____________________________________________________________________             [facility] - Preparing for Surgery Before surgery, you can play an important role.  Because skin is not sterile, your skin needs to be as free of germs as possible.  You can reduce the number of germs on your skin by washing with CHG (chlorahexidine gluconate) soap before surgery.  CHG is an antiseptic cleaner which kills germs and bonds with the skin to continue killing germs even after washing. Please DO NOT use if you have an allergy to CHG or antibacterial soaps.  If your skin becomes reddened/irritated stop using the CHG and inform your nurse when you arrive at Short Stay. Do not shave (including legs and underarms) for at least 48 hours prior to the first CHG shower.  You may shave your face/neck. Please follow these instructions carefully:  1.  Shower with CHG Soap the night before surgery and the  morning of Surgery.  2.  If you choose to wash your hair, wash your hair first as usual with your  normal  shampoo.  3.  After you shampoo, rinse your hair and body thoroughly to remove the  shampoo.                           4.  Use CHG as you would any other liquid soap.  You can apply chg directly  to the skin and wash                       Gently with a scrungie or clean washcloth.  5.  Apply the CHG Soap to your body ONLY FROM THE NECK DOWN.   Do not use on face/ open                           Wound or open sores. Avoid contact with eyes, ears mouth and genitals (private parts).                       Wash face,  Genitals (private parts) with  your normal soap.             6.  Wash thoroughly, paying special attention to the area where your surgery  will be performed.  7.  Thoroughly rinse your body with warm water from the neck down.  8.  DO NOT shower/wash with your normal soap after using and rinsing off  the CHG Soap.                9.  Pat yourself dry with a clean towel.            10.  Wear clean pajamas.            11.  Place  clean sheets on your bed the night of your first shower and do not  sleep with pets. Day of Surgery : Do not apply any lotions/deodorants the morning of surgery.  Please wear clean clothes to the hospital/surgery center.  FAILURE TO FOLLOW THESE INSTRUCTIONS MAY RESULT IN THE CANCELLATION OF YOUR SURGERY  ________________________________________________________________________  How to Manage Your Diabetes Before and After Surgery  Why is it important to control my blood sugar before and after surgery? . Improving blood sugar levels before and after surgery helps healing and can limit problems. . A way of improving blood sugar control is eating a healthy diet by: o  Eating less sugar and carbohydrates o  Increasing activity/exercise o  Talking with your doctor about reaching your blood sugar goals . High blood sugars (greater than 180 mg/dL) can raise your risk of infections and slow your recovery, so you will need to focus on controlling your diabetes during the weeks before surgery. . Make sure that the doctor who takes care of your diabetes knows about your planned surgery including the date and location.  How do I manage my blood sugar before surgery? . Check your blood sugar at least 4 times a day, starting 2 days before surgery, to make sure that the level is not too high or low. o Check your blood sugar the morning of your surgery when you wake up and every 2 hours until you get to the Short Stay unit. . If your blood sugar is less than 70 mg/dL, you will need to treat for low blood  sugar: o Do not take insulin. o Treat a low blood sugar (less than 70 mg/dL) with  cup of clear juice (cranberry or apple), 4 glucose tablets, OR glucose gel. o Recheck blood sugar in 15 minutes after treatment (to make sure it is greater than 70 mg/dL). If your blood sugar is not greater than 70 mg/dL on recheck, call [phone] for further instructions. . Report your blood sugar to the short stay nurse when you get to Short Stay.  . If you are admitted to the hospital after surgery: o Your blood sugar will be checked by the staff and you will probably be given insulin after surgery (instead of oral diabetes medicines) to make sure you have good blood sugar levels. o The goal for blood sugar control after surgery is 80-180 mg/dL.   WHAT DO I DO ABOUT MY DIABETES MEDICATION?  Marland Kitchen Do not take oral diabetes medicines (pills) the morning of surgery.   Patient Signature:  Date:   Nurse Signature:  Date:   Reviewed and Endorsed by [facility] Patient Education Committee, August 2015  Incentive Spirometer  An incentive spirometer is a tool that can help keep your lungs clear and active. This tool measures how well you are filling your lungs with each breath. Taking long deep breaths may help reverse or decrease the chance of developing breathing (pulmonary) problems (especially infection) following:  A long period of time when you are unable to move or be active. BEFORE THE PROCEDURE   If the spirometer includes an indicator to show your best effort, your nurse or respiratory therapist will set it to a desired goal.  If possible, sit up straight or lean slightly forward. Try not to slouch.  Hold the incentive spirometer in an upright position. INSTRUCTIONS FOR USE  1. Sit on the edge of your bed if possible, or sit up as far as you can in bed or on a  chair. 2. Hold the incentive spirometer in an upright position. 3. Breathe out normally. 4. Place the mouthpiece in your mouth and seal  your lips tightly around it. 5. Breathe in slowly and as deeply as possible, raising the piston or the ball toward the top of the column. 6. Hold your breath for 3-5 seconds or for as long as possible. Allow the piston or ball to fall to the bottom of the column. 7. Remove the mouthpiece from your mouth and breathe out normally. 8. Rest for a few seconds and repeat Steps 1 through 7 at least 10 times every 1-2 hours when you are awake. Take your time and take a few normal breaths between deep breaths. 9. The spirometer may include an indicator to show your best effort. Use the indicator as a goal to work toward during each repetition. 10. After each set of 10 deep breaths, practice coughing to be sure your lungs are clear. If you have an incision (the cut made at the time of surgery), support your incision when coughing by placing a pillow or rolled up towels firmly against it. Once you are able to get out of bed, walk around indoors and cough well. You may stop using the incentive spirometer when instructed by your caregiver.  RISKS AND COMPLICATIONS  Take your time so you do not get dizzy or light-headed.  If you are in pain, you may need to take or ask for pain medication before doing incentive spirometry. It is harder to take a deep breath if you are having pain. AFTER USE  Rest and breathe slowly and easily.  It can be helpful to keep track of a log of your progress. Your caregiver can provide you with a simple table to help with this. If you are using the spirometer at home, follow these instructions: [place] IF:   You are having difficultly using the spirometer.  You have trouble using the spirometer as often as instructed.  Your pain medication is not giving enough relief while using the spirometer.  You develop fever of 100.5 F (38.1 C) or higher. SEEK IMMEDIATE MEDICAL CARE IF:   You cough up bloody sputum that had not been present before.  You develop fever of  102 F (38.9 C) or greater.  You develop worsening pain at or near the incision site. MAKE SURE YOU:   Understand these instructions.  Will watch your condition.  Will get help right away if you are not doing well or get worse. Document Released: 02/06/2007 Document Revised: 12/19/2011 Document Reviewed: 04/09/2007 [facility] Patient Information 2014 [place], [place].   ________________________________________________________________________

## 2016-08-31 LAB — HEMOGLOBIN A1C
Hgb A1c MFr Bld: 6.3 % — ABNORMAL HIGH (ref 4.8–5.6)
Mean Plasma Glucose: 134 mg/dL

## 2016-09-06 ENCOUNTER — Ambulatory Visit (HOSPITAL_COMMUNITY)
Admission: RE | Admit: 2016-09-06 | Discharge: 2016-09-07 | Disposition: A | Discharge status: Home / Self Care | Payer: Medicare Other | Source: Ambulatory Visit | Attending: Gynecologic Oncology | Admitting: Gynecologic Oncology

## 2016-09-06 ENCOUNTER — Encounter (HOSPITAL_COMMUNITY)
Admission: RE | Disposition: A | Discharge status: Home / Self Care | Payer: Self-pay | Source: Ambulatory Visit | Attending: Gynecologic Oncology

## 2016-09-06 ENCOUNTER — Ambulatory Visit (HOSPITAL_COMMUNITY): Payer: Medicare Other | Admitting: Anesthesiology

## 2016-09-06 ENCOUNTER — Encounter (HOSPITAL_COMMUNITY): Payer: Self-pay | Admitting: *Deleted

## 2016-09-06 DIAGNOSIS — Z6834 Body mass index (BMI) 34.0-34.9, adult: Secondary | ICD-10-CM | POA: Diagnosis not present

## 2016-09-06 DIAGNOSIS — K219 Gastro-esophageal reflux disease without esophagitis: Secondary | ICD-10-CM | POA: Diagnosis not present

## 2016-09-06 DIAGNOSIS — Z7984 Long term (current) use of oral hypoglycemic drugs: Secondary | ICD-10-CM | POA: Diagnosis not present

## 2016-09-06 DIAGNOSIS — E7800 Pure hypercholesterolemia, unspecified: Secondary | ICD-10-CM | POA: Insufficient documentation

## 2016-09-06 DIAGNOSIS — C541 Malignant neoplasm of endometrium: Secondary | ICD-10-CM | POA: Insufficient documentation

## 2016-09-06 DIAGNOSIS — Z9889 Other specified postprocedural states: Secondary | ICD-10-CM | POA: Insufficient documentation

## 2016-09-06 DIAGNOSIS — D271 Benign neoplasm of left ovary: Secondary | ICD-10-CM | POA: Insufficient documentation

## 2016-09-06 DIAGNOSIS — E669 Obesity, unspecified: Secondary | ICD-10-CM | POA: Diagnosis not present

## 2016-09-06 DIAGNOSIS — Z833 Family history of diabetes mellitus: Secondary | ICD-10-CM | POA: Insufficient documentation

## 2016-09-06 DIAGNOSIS — I10 Essential (primary) hypertension: Secondary | ICD-10-CM | POA: Insufficient documentation

## 2016-09-06 DIAGNOSIS — Z79899 Other long term (current) drug therapy: Secondary | ICD-10-CM | POA: Insufficient documentation

## 2016-09-06 DIAGNOSIS — Z7982 Long term (current) use of aspirin: Secondary | ICD-10-CM | POA: Insufficient documentation

## 2016-09-06 DIAGNOSIS — E119 Type 2 diabetes mellitus without complications: Secondary | ICD-10-CM | POA: Diagnosis not present

## 2016-09-06 DIAGNOSIS — D270 Benign neoplasm of right ovary: Secondary | ICD-10-CM | POA: Insufficient documentation

## 2016-09-06 HISTORY — PX: ROBOTIC ASSISTED LAP VAGINAL HYSTERECTOMY: SHX2362

## 2016-09-06 HISTORY — PX: SENTINEL NODE BIOPSY: SHX6608

## 2016-09-06 HISTORY — PX: ROBOTIC ASSISTED SALPINGO OOPHERECTOMY: SHX6082

## 2016-09-06 LAB — TYPE AND SCREEN
ABO/RH(D): A NEG
Antibody Screen: NEGATIVE

## 2016-09-06 LAB — GLUCOSE, CAPILLARY
GLUCOSE-CAPILLARY: 157 mg/dL — AB (ref 65–99)
GLUCOSE-CAPILLARY: 165 mg/dL — AB (ref 65–99)
Glucose-Capillary: 125 mg/dL — ABNORMAL HIGH (ref 65–99)
Glucose-Capillary: 106 mg/dL — ABNORMAL HIGH (ref 65–99)

## 2016-09-06 SURGERY — HYSTERECTOMY, VAGINAL, ROBOT-ASSISTED
Anesthesia: General | Site: Abdomen

## 2016-09-06 MED ORDER — IBUPROFEN 800 MG PO TABS: 800.0000 mg | ORAL_TABLET | Freq: Three times a day (TID) | ORAL | Status: DC | PRN

## 2016-09-06 MED ORDER — PRAVASTATIN SODIUM 20 MG PO TABS: 20.0000 mg | ORAL_TABLET | Freq: Every evening | ORAL | Status: DC

## 2016-09-06 MED ORDER — ROCURONIUM BROMIDE 10 MG/ML (PF) SYRINGE
PREFILLED_SYRINGE | INTRAVENOUS | Status: DC | PRN
  Administered 2016-09-06: 10 mg via INTRAVENOUS
  Administered 2016-09-06: 50 mg via INTRAVENOUS

## 2016-09-06 MED ORDER — MIDAZOLAM HCL 2 MG/2ML IJ SOLN
INTRAMUSCULAR | Status: AC
  Filled 2016-09-06: qty 2

## 2016-09-06 MED ORDER — DEXAMETHASONE SODIUM PHOSPHATE 10 MG/ML IJ SOLN
INTRAMUSCULAR | Status: DC | PRN
  Administered 2016-09-06: 10 mg via INTRAVENOUS

## 2016-09-06 MED ORDER — ONDANSETRON HCL 4 MG/2ML IJ SOLN: 4.0000 mg | Freq: Four times a day (QID) | INTRAMUSCULAR | Status: DC | PRN

## 2016-09-06 MED ORDER — ROCURONIUM BROMIDE 50 MG/5ML IV SOSY
PREFILLED_SYRINGE | INTRAVENOUS | Status: AC
  Filled 2016-09-06: qty 10

## 2016-09-06 MED ORDER — PROPOFOL 10 MG/ML IV BOLUS
INTRAVENOUS | Status: DC | PRN
  Administered 2016-09-06: 180 mg via INTRAVENOUS

## 2016-09-06 MED ORDER — ONDANSETRON HCL 4 MG/2ML IJ SOLN
INTRAMUSCULAR | Status: DC | PRN
  Administered 2016-09-06: 4 mg via INTRAVENOUS

## 2016-09-06 MED ORDER — DEXAMETHASONE SODIUM PHOSPHATE 10 MG/ML IJ SOLN
INTRAMUSCULAR | Status: AC
  Filled 2016-09-06: qty 1

## 2016-09-06 MED ORDER — STERILE WATER FOR IRRIGATION IR SOLN
Status: DC | PRN
  Administered 2016-09-06: 1000 mL

## 2016-09-06 MED ORDER — ONDANSETRON HCL 4 MG/2ML IJ SOLN
INTRAMUSCULAR | Status: AC
  Filled 2016-09-06: qty 2

## 2016-09-06 MED ORDER — OXYCODONE-ACETAMINOPHEN 5-325 MG PO TABS: 1.0000 | ORAL_TABLET | ORAL | Status: DC | PRN

## 2016-09-06 MED ORDER — HYDROMORPHONE HCL 1 MG/ML IJ SOLN
INTRAMUSCULAR | Status: AC
  Administered 2016-09-06: 0.5 mg via INTRAVENOUS
  Filled 2016-09-06: qty 1

## 2016-09-06 MED ORDER — PHENYLEPHRINE 40 MCG/ML (10ML) SYRINGE FOR IV PUSH (FOR BLOOD PRESSURE SUPPORT)
PREFILLED_SYRINGE | INTRAVENOUS | Status: DC | PRN
  Administered 2016-09-06: 80 ug via INTRAVENOUS

## 2016-09-06 MED ORDER — CEFAZOLIN SODIUM-DEXTROSE 2-4 GM/100ML-% IV SOLN
2.0000 g | INTRAVENOUS | Status: AC
  Administered 2016-09-06: 2 g via INTRAVENOUS
  Filled 2016-09-06: qty 100

## 2016-09-06 MED ORDER — ASPIRIN EC 81 MG PO TBEC: 81.0000 mg | DELAYED_RELEASE_TABLET | Freq: Every evening | ORAL | Status: DC

## 2016-09-06 MED ORDER — GABAPENTIN 600 MG PO TABS
300.0000 mg | ORAL_TABLET | Freq: Every day | ORAL | Status: DC
  Filled 2016-09-06: qty 0.5

## 2016-09-06 MED ORDER — CEFAZOLIN SODIUM-DEXTROSE 2-4 GM/100ML-% IV SOLN
INTRAVENOUS | Status: AC
  Filled 2016-09-06: qty 100

## 2016-09-06 MED ORDER — INSULIN ASPART 100 UNIT/ML ~~LOC~~ SOLN
0.0000 [IU] | Freq: Three times a day (TID) | SUBCUTANEOUS | Status: DC
Start: 2016-09-07 — End: 2016-09-07
  Administered 2016-09-07: 3 [IU] via SUBCUTANEOUS

## 2016-09-06 MED ORDER — SUGAMMADEX SODIUM 200 MG/2ML IV SOLN
INTRAVENOUS | Status: AC
  Filled 2016-09-06: qty 2

## 2016-09-06 MED ORDER — LOSARTAN POTASSIUM-HCTZ 100-25 MG PO TABS: 1.0000 | ORAL_TABLET | Freq: Every day | ORAL | Status: DC

## 2016-09-06 MED ORDER — ONDANSETRON HCL 4 MG PO TABS: 4.0000 mg | ORAL_TABLET | Freq: Four times a day (QID) | ORAL | Status: DC | PRN

## 2016-09-06 MED ORDER — TRAMADOL HCL 50 MG PO TABS: 100.0000 mg | ORAL_TABLET | Freq: Two times a day (BID) | ORAL | Status: DC | PRN

## 2016-09-06 MED ORDER — GABAPENTIN 300 MG PO CAPS
300.0000 mg | ORAL_CAPSULE | Freq: Every day | ORAL | Status: DC
  Administered 2016-09-06: 300 mg via ORAL
  Filled 2016-09-06: qty 1

## 2016-09-06 MED ORDER — FENTANYL CITRATE (PF) 100 MCG/2ML IJ SOLN
INTRAMUSCULAR | Status: DC | PRN
  Administered 2016-09-06 (×3): 50 ug via INTRAVENOUS
  Administered 2016-09-06: 100 ug via INTRAVENOUS

## 2016-09-06 MED ORDER — KCL IN DEXTROSE-NACL 20-5-0.45 MEQ/L-%-% IV SOLN
INTRAVENOUS | Status: DC
  Administered 2016-09-06: 20:00:00 via INTRAVENOUS
  Filled 2016-09-06: qty 1000

## 2016-09-06 MED ORDER — ENOXAPARIN SODIUM 40 MG/0.4ML ~~LOC~~ SOLN
40.0000 mg | SUBCUTANEOUS | Status: DC
  Administered 2016-09-07: 40 mg via SUBCUTANEOUS
  Filled 2016-09-06: qty 0.4

## 2016-09-06 MED ORDER — LACTATED RINGERS IV SOLN
INTRAVENOUS | Status: DC | PRN
  Administered 2016-09-06: 3000 mL

## 2016-09-06 MED ORDER — HYDROMORPHONE HCL 1 MG/ML IJ SOLN
0.2500 mg | INTRAMUSCULAR | Status: DC | PRN
  Administered 2016-09-06 (×3): 0.5 mg via INTRAVENOUS

## 2016-09-06 MED ORDER — LOSARTAN POTASSIUM 50 MG PO TABS
100.0000 mg | ORAL_TABLET | Freq: Every day | ORAL | Status: DC
  Administered 2016-09-07: 100 mg via ORAL
  Filled 2016-09-06: qty 2

## 2016-09-06 MED ORDER — MIDAZOLAM HCL 5 MG/5ML IJ SOLN
INTRAMUSCULAR | Status: DC | PRN
  Administered 2016-09-06: 2 mg via INTRAVENOUS

## 2016-09-06 MED ORDER — HYDROMORPHONE HCL 1 MG/ML IJ SOLN: 0.2000 mg | INTRAMUSCULAR | Status: DC | PRN

## 2016-09-06 MED ORDER — PANTOPRAZOLE SODIUM 40 MG PO TBEC
40.0000 mg | DELAYED_RELEASE_TABLET | Freq: Every day | ORAL | Status: DC
  Administered 2016-09-07: 40 mg via ORAL
  Filled 2016-09-06: qty 1

## 2016-09-06 MED ORDER — LACTATED RINGERS IV SOLN
INTRAVENOUS | Status: DC
  Administered 2016-09-06 (×2): via INTRAVENOUS

## 2016-09-06 MED ORDER — SUGAMMADEX SODIUM 200 MG/2ML IV SOLN
INTRAVENOUS | Status: DC | PRN
  Administered 2016-09-06: 170 mg via INTRAVENOUS

## 2016-09-06 MED ORDER — HYDROCHLOROTHIAZIDE 25 MG PO TABS
25.0000 mg | ORAL_TABLET | Freq: Every day | ORAL | Status: DC
  Administered 2016-09-07: 25 mg via ORAL
  Filled 2016-09-06: qty 1

## 2016-09-06 MED ORDER — FENTANYL CITRATE (PF) 250 MCG/5ML IJ SOLN
INTRAMUSCULAR | Status: AC
  Filled 2016-09-06: qty 5

## 2016-09-06 MED ORDER — LIDOCAINE 2% (20 MG/ML) 5 ML SYRINGE
INTRAMUSCULAR | Status: DC | PRN
  Administered 2016-09-06: 100 mg via INTRAVENOUS

## 2016-09-06 MED ORDER — PROPOFOL 10 MG/ML IV BOLUS
INTRAVENOUS | Status: AC
  Filled 2016-09-06: qty 20

## 2016-09-06 MED ORDER — AMLODIPINE BESYLATE 5 MG PO TABS
5.0000 mg | ORAL_TABLET | Freq: Every day | ORAL | Status: DC
  Administered 2016-09-07: 5 mg via ORAL
  Filled 2016-09-06: qty 1

## 2016-09-06 MED ORDER — ENOXAPARIN SODIUM 40 MG/0.4ML ~~LOC~~ SOLN
40.0000 mg | SUBCUTANEOUS | Status: AC
  Administered 2016-09-06: 40 mg via SUBCUTANEOUS
  Filled 2016-09-06: qty 0.4

## 2016-09-06 SURGICAL SUPPLY — 61 items
APPLICATOR SURGIFLO ENDO (HEMOSTASIS) IMPLANT
BAG LAPAROSCOPIC 12 15 PORT 16 (BASKET) IMPLANT
BAG RETRIEVAL 12/15 (BASKET)
CHLORAPREP W/TINT 26ML (MISCELLANEOUS) ×3 IMPLANT
COVER SURGICAL LIGHT HANDLE (MISCELLANEOUS) ×3 IMPLANT
COVER TIP SHEARS 8 DVNC (MISCELLANEOUS) ×2 IMPLANT
COVER TIP SHEARS 8MM DA VINCI (MISCELLANEOUS) ×1
DERMABOND ADVANCED (GAUZE/BANDAGES/DRESSINGS) ×1
DERMABOND ADVANCED .7 DNX12 (GAUZE/BANDAGES/DRESSINGS) ×2 IMPLANT
DRAPE ARM DVNC X/XI (DISPOSABLE) ×8 IMPLANT
DRAPE COLUMN DVNC XI (DISPOSABLE) ×2 IMPLANT
DRAPE DA VINCI XI ARM (DISPOSABLE) ×4
DRAPE DA VINCI XI COLUMN (DISPOSABLE) ×1
DRAPE SHEET LG 3/4 BI-LAMINATE (DRAPES) ×6 IMPLANT
DRAPE SURG IRRIG POUCH 19X23 (DRAPES) ×3 IMPLANT
ELECT REM PT RETURN 15FT ADLT (MISCELLANEOUS) ×3 IMPLANT
ELECT REM PT RETURN 9FT ADLT (ELECTROSURGICAL) ×3
ELECTRODE REM PT RTRN 9FT ADLT (ELECTROSURGICAL) ×2 IMPLANT
GLOVE BIO SURGEON STRL SZ 6 (GLOVE) ×12 IMPLANT
GLOVE BIO SURGEON STRL SZ 6.5 (GLOVE) ×6 IMPLANT
GOWN STRL REUS W/ TWL LRG LVL3 (GOWN DISPOSABLE) ×6 IMPLANT
GOWN STRL REUS W/TWL LRG LVL3 (GOWN DISPOSABLE) ×3
HOLDER FOLEY CATH W/STRAP (MISCELLANEOUS) ×3 IMPLANT
IRRIG SUCT STRYKERFLOW 2 WTIP (MISCELLANEOUS) ×3
IRRIGATION SUCT STRKRFLW 2 WTP (MISCELLANEOUS) ×2 IMPLANT
KIT BASIN OR (CUSTOM PROCEDURE TRAY) ×3 IMPLANT
KIT PROCEDURE DA VINCI SI (MISCELLANEOUS) ×1
KIT PROCEDURE DVNC SI (MISCELLANEOUS) ×2 IMPLANT
MANIPULATOR UTERINE 4.5 ZUMI (MISCELLANEOUS) ×3 IMPLANT
MARKER SKIN DUAL TIP RULER LAB (MISCELLANEOUS) ×3 IMPLANT
NDL SAFETY ECLIPSE 18X1.5 (NEEDLE) ×2 IMPLANT
NEEDLE HYPO 18GX1.5 SHARP (NEEDLE) ×1
NEEDLE SPNL 18GX3.5 QUINCKE PK (NEEDLE) ×3 IMPLANT
OBTURATOR OPTICAL STANDARD 8MM (TROCAR) ×1
OBTURATOR OPTICAL STND 8 DVNC (TROCAR) ×2
OBTURATOR OPTICALSTD 8 DVNC (TROCAR) ×2 IMPLANT
OCCLUDER COLPOPNEUMO (BALLOONS) ×3 IMPLANT
PAD POSITIONING PINK XL (MISCELLANEOUS) ×3 IMPLANT
PORT ACCESS TROCAR AIRSEAL 12 (TROCAR) ×2 IMPLANT
PORT ACCESS TROCAR AIRSEAL 5M (TROCAR) ×1
POUCH SPECIMEN RETRIEVAL 10MM (ENDOMECHANICALS) ×6 IMPLANT
SEAL CANN UNIV 5-8 DVNC XI (MISCELLANEOUS) ×8 IMPLANT
SEAL XI 5MM-8MM UNIVERSAL (MISCELLANEOUS) ×4
SET TRI-LUMEN FLTR TB AIRSEAL (TUBING) ×3 IMPLANT
SHEET LAVH (DRAPES) ×3 IMPLANT
SOLUTION ELECTROLUBE (MISCELLANEOUS) ×3 IMPLANT
SURGIFLO W/THROMBIN 8M KIT (HEMOSTASIS) IMPLANT
SUT MNCRL AB 4-0 PS2 18 (SUTURE) ×6 IMPLANT
SUT VIC AB 0 CT1 27 (SUTURE) ×1
SUT VIC AB 0 CT1 27XBRD ANTBC (SUTURE) ×2 IMPLANT
SYR 10ML LL (SYRINGE) ×3 IMPLANT
SYR 50ML LL SCALE MARK (SYRINGE) ×3 IMPLANT
TOWEL OR 17X26 10 PK STRL BLUE (TOWEL DISPOSABLE) ×6 IMPLANT
TOWEL OR NON WOVEN STRL DISP B (DISPOSABLE) ×3 IMPLANT
TRAP SPECIMEN MUCOUS 40CC (MISCELLANEOUS) IMPLANT
TRAY FOLEY W/METER SILVER 16FR (SET/KITS/TRAYS/PACK) ×3 IMPLANT
TRAY LAPAROSCOPIC (CUSTOM PROCEDURE TRAY) ×3 IMPLANT
TROCAR BLADELESS OPT 5 100 (ENDOMECHANICALS) ×3 IMPLANT
UNDERPAD 30X30 (UNDERPADS AND DIAPERS) ×3 IMPLANT
UNDERPAD 30X30 INCONTINENT (UNDERPADS AND DIAPERS) ×3 IMPLANT
WATER STERILE IRR 1500ML POUR (IV SOLUTION) ×6 IMPLANT

## 2016-09-06 NOTE — Transfer of Care (Signed)
Immediate Anesthesia Transfer of Care Note  Patient: Jennifer Morrison  Procedure(s) Performed: Procedure(s): XI ROBOTIC ASSISTED LAPAROSCOPIC TOTAL HYSTERECTOMY (N/A) XI ROBOTIC ASSISTED SALPINGO OOPHORECTOMY (Bilateral) SENTINEL NODE BIOPSY (N/A)  Patient Location: PACU  Anesthesia Type:General  Level of Consciousness: awake, alert , oriented and patient cooperative  Airway & Oxygen Therapy: Patient Spontanous Breathing and Patient connected to face mask oxygen  Post-op Assessment: Report given to RN, Post -op Vital signs reviewed and stable and Patient moving all extremities  Post vital signs: Reviewed and stable  Last Vitals:  Vitals:   09/06/16 0947  BP: (!) 172/83  Pulse: 94  Resp: 18  Temp: 36.5 C    Last Pain:  Vitals:   09/06/16 0947  TempSrc: Oral         Complications: No apparent anesthesia complications

## 2016-09-06 NOTE — Op Note (Signed)
OPERATIVE NOTE 09/06/16  Surgeon: [name]   Assistants: Dr Lahoma Crocker (an MD assistant was necessary for tissue manipulation, management of robotic instrumentation, retraction and positioning due to the complexity of the case and hospital policies).   Anesthesia: General endotracheal anesthesia  ASA Class: 3   Pre-operative Diagnosis: endometrial cancer  Post-operative Diagnosis: same  Operation: Robotic-assisted laparoscopic total hysterectomy with bilateral salpingoophorectomy, sentinel lymph node biopsy   Surgeon: [name]  Assistant Surgeon: Lahoma Crocker MD  Anesthesia: GET  Urine Output: 300  Operative Findings:  : 6cm uterus, fibromas bilaterally on ovaries, no gross extrauterine disease, bilateral mapping.   Estimated Blood Loss:  less than 100 mL      Total IV Fluids: 800 ml         Specimens: uterus, cervix, bilateral tubes and ovaries, right external iliac SLN, right common iliac SLN, left external iliac SLN         Complications:  None; patient tolerated the procedure well.         Disposition: PACU - hemodynamically stable.  Procedure Details  The patient was seen in the Holding Room. The risks, benefits, complications, treatment options, and expected outcomes were discussed with the patient.  The patient concurred with the proposed plan, giving informed consent.  The site of surgery properly noted/marked. The patient was identified as Jennifer Morrison and the procedure verified as a Robotic-assisted hysterectomy with bilateral salpingo oophorectomy. A Time Out was held and the above information confirmed.  After induction of anesthesia, the patient was draped and prepped in the usual sterile manner. Pt was placed in supine position after anesthesia and draped and prepped in the usual sterile manner. The abdominal drape was placed after the CholoraPrep had been allowed to dry for 3 minutes.  Her arms were tucked to her side with  all appropriate precautions.  The shoulders were stabilized with padded shoulder blocks applied to the acromium processes.  The patient was placed in the semi-lithotomy position in [place].  The perineum was prepped with Betadine. The patient was then prepped. Foley catheter was placed.  A sterile speculum was placed in the vagina. 1mg  total of ICG was injected into the cervical stroma at 2 and 9 o'clock at a 23mm depth (concentration 0..5mg /ml).  The cervix was grasped with a single-tooth tenaculum and dilated with Kennon Rounds dilators.  The ZUMI uterine manipulator with a medium colpotomizer ring was placed without difficulty.  A pneum occluder balloon was placed over the manipulator.  OG tube placement was confirmed and to suction.   Next, a 5 mm skin incision was made 1 cm below the subcostal margin in the midclavicular line.  The 5 mm Optiview port and scope was used for direct entry.  Opening pressure was under 10 mm CO2.  The abdomen was insufflated and the findings were noted as above.   At this point and all points during the procedure, the patient's intra-abdominal pressure did not exceed 15 mmHg. Next, a 10 mm skin incision was made in the umbilicus and a right and left port was placed about 10 cm lateral to the robot port on the right and left side.  A fourth arm was placed in the left lower quadrant 2 cm above and superior and medial to the anterior superior iliac spine.  All ports were placed under direct visualization.  The patient was placed in steep Trendelenburg.  Bowel was folded away into the upper abdomen.  The robot was docked in  the normal manner.  The right and left peritoneum were opened parallel to the IP ligament to open the retroperitoneal spaces bilaterally. The SLN mapping was performed in bilateral pelvic basins. The para rectal and paravesical spaces were opened up. Lymphatic channels were identified travelling to the following visualized sentinel lymph node's: left and right  external iliac and a deep channel on the right to a medial common iliac SLN on the sacral promontory. These SLN's were separated from their surrounding lymphatic tissue, removed and sent for permanent pathology.  The hysterectomy was started after the round ligament on the right side was incised and the retroperitoneum was entered and the pararectal space was developed.  The ureter was noted to be on the medial leaf of the broad ligament.  The peritoneum above the ureter was incised and stretched and the infundibulopelvic ligament was skeletonized, cauterized and cut.  The posterior peritoneum was taken down to the level of the KOH ring.  The anterior peritoneum was also taken down.  The bladder flap was created to the level of the KOH ring.  The uterine artery on the right side was skeletonized, cauterized and cut in the normal manner.  A similar procedure was performed on the left.  The colpotomy was made and the uterus, cervix, bilateral ovaries and tubes were amputated and delivered through the vagina.  Pedicles were inspected and excellent hemostasis was achieved.    The colpotomy at the vaginal cuff was closed with Vicryl on a CT1 needle in a running manner.  Irrigation was used and excellent hemostasis was achieved.  At this point in the procedure was completed.  Robotic instruments were removed under direct visulaization.  The robot was undocked. The 10 mm ports were closed with Vicryl on a UR-5 needle and the fascia was closed with 0 Vicryl on a UR-5 needle.  The skin was closed with 4-0 Vicryl in a subcuticular manner.  Dermabond was applied.  Sponge, lap and needle counts correct x 2.  The patient was taken to the recovery room in stable condition.  The vagina was swabbed with  minimal bleeding noted.   All instrument and needle counts were correct x  3.   The patient was transferred to the recovery room in a stable condition.  [name], MD

## 2016-09-06 NOTE — Anesthesia Procedure Notes (Signed)
Procedure Name: Intubation Date/Time: 09/06/2016 3:25 PM Performed by: Noralyn Pick D Pre-anesthesia Checklist: Patient identified, Emergency Drugs available, Suction available and Patient being monitored Patient Re-evaluated:Patient Re-evaluated prior to inductionOxygen Delivery Method: Circle system utilized Preoxygenation: Pre-oxygenation with 100% oxygen Intubation Type: IV induction Ventilation: Mask ventilation without difficulty Laryngoscope Size: Mac and 4 Grade View: Grade I Tube type: Oral Number of attempts: 1 Airway Equipment and Method: Stylet Placement Confirmation: ETT inserted through vocal cords under direct vision,  positive ETCO2 and breath sounds checked- equal and bilateral Secured at: 21 cm Tube secured with: Tape Dental Injury: Teeth and Oropharynx as per pre-operative assessment

## 2016-09-06 NOTE — Anesthesia Preprocedure Evaluation (Addendum)
Anesthesia Evaluation  Patient identified by MRN, date of birth, ID band Patient awake    Reviewed: Allergy & Precautions, H&P , NPO status , Patient's Chart, lab work & pertinent test results, reviewed documented beta blocker date and time   Airway Mallampati: II  TM Distance: >3 FB Neck ROM: full    Dental no notable dental hx.    Pulmonary neg pulmonary ROS,    Pulmonary exam normal breath sounds clear to auscultation       Cardiovascular hypertension, Pt. on medications negative cardio ROS   Rhythm:regular Rate:Normal     Neuro/Psych negative neurological ROS  negative psych ROS   GI/Hepatic negative GI ROS, Neg liver ROS, GERD  Medicated and Controlled,  Endo/Other  negative endocrine ROSdiabetes, Type 2, Oral Hypoglycemic Agents  Renal/GU negative Renal ROS   Uterine CA negative genitourinary   Musculoskeletal   Abdominal   Peds  Hematology negative hematology ROS (+)   Anesthesia Other Findings   Reproductive/Obstetrics negative OB ROS                            Anesthesia Physical Anesthesia Plan  ASA: II  Anesthesia Plan: General   Post-op Pain Management:    Induction: Intravenous  Airway Management Planned: Oral ETT  Additional Equipment:   Intra-op Plan:   Post-operative Plan: Extubation in OR  Informed Consent: I have reviewed the patients History and Physical, chart, labs and discussed the procedure including the risks, benefits and alternatives for the proposed anesthesia with the patient or authorized representative who has indicated his/her understanding and acceptance.   Dental advisory given  Plan Discussed with: CRNA  Anesthesia Plan Comments:         Anesthesia Quick Evaluation

## 2016-09-06 NOTE — Interval H&P Note (Signed)
History and Physical Interval Note:  09/06/2016 2:46 PM  Jennifer Morrison  has presented today for surgery, with the diagnosis of UTERINE CANCER  The various methods of treatment have been discussed with the patient and family. After consideration of risks, benefits and other options for treatment, the patient has consented to  Procedure(s): XI ROBOTIC ASSISTED LAPAROSCOPIC TOTAL HYSTERECTOMY (N/A) XI ROBOTIC ASSISTED SALPINGO OOPHORECTOMY (Bilateral) SENTINEL NODE BIOPSY (N/A) as a surgical intervention .  The patient's history has been reviewed, patient examined, no change in status, stable for surgery.  I have reviewed the patient's chart and labs.  Questions were answered to the patient's satisfaction.     [name]

## 2016-09-06 NOTE — H&P (View-Only) (Signed)
Consult Note: Gyn-Onc  Consult was requested by Dr. Nori Riis for the evaluation of Jennifer Morrison 70 y.o. female  CC:  Chief Complaint  Patient presents with  . uterine cancer    New consultation    Assessment/Plan:  Jennifer Morrison  is a 70 y.o.  year old with endoemetrial cancer.  I discussed the nature of the disease and potential options for treatment including surgery with hysterectomy vs medical therapy with progestins. I recommended surgery as the most definitive treatment as she is post-menopausal and in general reasonable health.  She is electing for robotic assisted hysterectomy, BSO and SLN biopsy. I discussed that SLN's will be mapped and frozen section sent to evaluate for malignancy if there is unsuccessful mapping.  I discussed operative risks including  bleeding, infection, damage to internal organs (such as bladder,ureters, bowels), blood clot, reoperation and rehospitalization. I discussed anticipated recovery and restrictions.  She was offered a date on 21st or 28th and elected for the 28th.  The patient will continue to hold her ASA preoperatively.   HPI: Jennifer Morrison is a 70 year old G2P2 who is seen in consultation at the request of Dr Nori Riis for endometrial cancer.    The patient reports a single episode of postmenopausal bleeding in September, 2017. Dr Nori Riis performed an [place] on 07/14/16 which showed a 5.6x3.1x3.8cm uterus with a 1.4cm endometrial stripe (no adnexal masses). A hysteroscopy and D&C was performed on 08/03/16 which revealed endometrial adenocarcinoma associated with CAH (FIGO grade 1).  The patient is obese and has DM (HbA1C 6%) on metformin. The patient has HTN.   Current Meds:  Outpatient Encounter Prescriptions as of 08/15/2016  Medication Sig  . ACCU-CHEK AVIVA PLUS test strip USE AS DIRECTED EVERY DAY  . ACCU-CHEK FASTCLIX LANCETS MISC USE AS DIRECTED EVERY DAY  . amLODipine (NORVASC) 5 MG tablet Take 5 mg by mouth daily.  Marland Kitchen aspirin EC 81 MG  tablet Take 81 mg by mouth every evening.  . Cholecalciferol (VITAMIN D PO) Take 1 tablet by mouth daily.  Marland Kitchen losartan-hydrochlorothiazide (HYZAAR) 100-25 MG per tablet Take 1 tablet by mouth daily.  . metFORMIN (GLUCOPHAGE) 500 MG tablet   . omeprazole (PRILOSEC) 20 MG capsule Take 20 mg by mouth daily.  . Potassium 99 MG TABS Take 1 tablet by mouth.  . pravastatin (PRAVACHOL) 20 MG tablet Take 20 mg by mouth daily.   No facility-administered encounter medications on file as of 08/15/2016.     Allergy: No Known Allergies  Social Hx:   Social History   Social History  . Marital status: Married    Spouse name: N/A  . Number of children: N/A  . Years of education: N/A   Occupational History  . Not on file.   Social History Main Topics  . Smoking status: Never Smoker  . Smokeless tobacco: Never Used  . Alcohol use No  . Drug use: No  . Sexual activity: Not on file   Other Topics Concern  . Not on file   Social History Narrative  . No narrative on file    Past Surgical Hx:  Past Surgical History:  Procedure Laterality Date  . COLONOSCOPY  2005  . COLONOSCOPY N/A 11/28/2013   Procedure: COLONOSCOPY;  Surgeon: Rogene Houston, MD;  [facility];  Service: Endoscopy;  Laterality: N/A;  830  . Polyp removed from uterus      Past Medical Hx:  Past Medical History:  Diagnosis Date  .  Diabetes mellitus without complication ([place])   . GERD (gastroesophageal reflux disease)   . Hypercholesteremia   . Hypertension   . Uterine cancer ([place]) 08/14/2016    Past Gynecological History:  G2P2 SVD x 2 No LMP recorded. Patient is postmenopausal.  Family Hx:  Family History  Problem Relation Age of Onset  . Diabetes Father   . Diabetes Brother   . Colon cancer Neg Hx     Review of Systems:  Constitutional  Feels well,    ENT Normal appearing ears and nares bilaterally Skin/Breast  No rash, sores, jaundice, itching, dryness Cardiovascular  No chest pain,  shortness of breath, or edema  Pulmonary  No cough or wheeze.  Gastro Intestinal  No nausea, vomitting, or diarrhoea. No bright red blood per rectum, no abdominal pain, change in bowel movement, or constipation.  Genito Urinary  No frequency, urgency, dysuria, + postmenopausal bleeding Musculo Skeletal  No myalgia, arthralgia, joint swelling or pain  Neurologic  No weakness, numbness, change in gait,  Psychology  No depression, anxiety, insomnia.   Vitals:  Blood pressure (!) 176/77, pulse 93, temperature 97.8 F (36.6 C), temperature source Oral, resp. rate 18, weight 183 lb 9.6 oz (83.3 kg), SpO2 98 %.  Physical Exam: WD in NAD Neck  Supple NROM, without any enlargements.  Lymph Node Survey No cervical supraclavicular or inguinal adenopathy Cardiovascular  Pulse normal rate, regularity and rhythm. S1 and S2 normal.  Lungs  Clear to auscultation bilateraly, without wheezes/crackles/rhonchi. Good air movement.  Skin  No rash/lesions/breakdown  Psychiatry  Alert and oriented to person, place, and time  Abdomen  Normoactive bowel sounds, abdomen soft, non-tender and obese without evidence of hernia.  Back No CVA tenderness Genito Urinary  Vulva/vagina: Normal external female genitalia.   No lesions. No discharge or bleeding.  Bladder/urethra:  No lesions or masses, well supported bladder  Vagina: normal  Cervix: Normal appearing, no lesions.  Uterus:  Small, mobile, no parametrial involvement or nodularity.  Adnexa: no palpable masses. Rectal  deferred Extremities  No bilateral cyanosis, clubbing or edema.   [name], MD  08/15/2016, 9:32 AM

## 2016-09-06 NOTE — Anesthesia Postprocedure Evaluation (Signed)
Anesthesia Post Note  Patient: Jennifer Morrison  Procedure(s) Performed: Procedure(s) (LRB): XI ROBOTIC ASSISTED LAPAROSCOPIC TOTAL HYSTERECTOMY (N/A) XI ROBOTIC ASSISTED SALPINGO OOPHORECTOMY (Bilateral) SENTINEL NODE BIOPSY (N/A)  Patient location during evaluation: PACU Anesthesia Type: General Level of consciousness: sedated Pain management: satisfactory to patient Vital Signs Assessment: post-procedure vital signs reviewed and stable Respiratory status: spontaneous breathing Cardiovascular status: stable Anesthetic complications: no    Last Vitals:  Vitals:   09/06/16 1800 09/06/16 1819  BP: (!) 128/53 140/70  Pulse: 61 82  Resp: 14 15  Temp: 36.6 C 36.8 C    Last Pain:  Vitals:   09/06/16 1800  TempSrc:   PainSc: [place]

## 2016-09-07 DIAGNOSIS — E119 Type 2 diabetes mellitus without complications: Secondary | ICD-10-CM | POA: Diagnosis not present

## 2016-09-07 DIAGNOSIS — K219 Gastro-esophageal reflux disease without esophagitis: Secondary | ICD-10-CM | POA: Diagnosis not present

## 2016-09-07 DIAGNOSIS — Z833 Family history of diabetes mellitus: Secondary | ICD-10-CM | POA: Diagnosis not present

## 2016-09-07 DIAGNOSIS — Z7984 Long term (current) use of oral hypoglycemic drugs: Secondary | ICD-10-CM | POA: Diagnosis not present

## 2016-09-07 DIAGNOSIS — I10 Essential (primary) hypertension: Secondary | ICD-10-CM | POA: Diagnosis not present

## 2016-09-07 DIAGNOSIS — Z9889 Other specified postprocedural states: Secondary | ICD-10-CM | POA: Diagnosis not present

## 2016-09-07 DIAGNOSIS — E7800 Pure hypercholesterolemia, unspecified: Secondary | ICD-10-CM | POA: Diagnosis not present

## 2016-09-07 DIAGNOSIS — Z7982 Long term (current) use of aspirin: Secondary | ICD-10-CM | POA: Diagnosis not present

## 2016-09-07 DIAGNOSIS — Z79899 Other long term (current) drug therapy: Secondary | ICD-10-CM | POA: Diagnosis not present

## 2016-09-07 DIAGNOSIS — C541 Malignant neoplasm of endometrium: Secondary | ICD-10-CM | POA: Diagnosis not present

## 2016-09-07 LAB — BASIC METABOLIC PANEL
ANION GAP: 7 (ref 5–15)
BUN: 20 mg/dL (ref 6–20)
CHLORIDE: 104 mmol/L (ref 101–111)
CO2: 25 mmol/L (ref 22–32)
CREATININE: 1.32 mg/dL — AB (ref 0.44–1.00)
Calcium: 8.6 mg/dL — ABNORMAL LOW (ref 8.9–10.3)
GFR calc non Af Amer: 40 mL/min — ABNORMAL LOW (ref >60)
GFR, EST AFRICAN AMERICAN: 47 mL/min — AB (ref >60)
Glucose, Bld: 185 mg/dL — ABNORMAL HIGH (ref 65–99)
Potassium: 4.9 mmol/L (ref 3.5–5.1)
SODIUM: 136 mmol/L (ref 135–145)

## 2016-09-07 LAB — GLUCOSE, CAPILLARY
GLUCOSE-CAPILLARY: 162 mg/dL — AB (ref 65–99)
GLUCOSE-CAPILLARY: 219 mg/dL — AB (ref 65–99)

## 2016-09-07 LAB — CBC
HCT: 35.5 % — ABNORMAL LOW (ref 36.0–46.0)
HEMOGLOBIN: 12 g/dL (ref 12.0–15.0)
MCH: 31.3 pg (ref 26.0–34.0)
MCHC: 33.8 g/dL (ref 30.0–36.0)
MCV: 92.7 fL (ref 78.0–100.0)
Platelets: 333 10*3/uL (ref 150–400)
RBC: 3.83 MIL/uL — AB (ref 3.87–5.11)
RDW: 12.7 % (ref 11.5–15.5)
WBC: 11.5 10*3/uL — AB (ref 4.0–10.5)

## 2016-09-07 MED ORDER — TRAMADOL HCL 50 MG PO TABS: 100.0000 mg | ORAL_TABLET | Freq: Two times a day (BID) | ORAL | 0 refills | Status: DC | PRN

## 2016-09-07 MED ORDER — OXYCODONE-ACETAMINOPHEN 5-325 MG PO TABS: 1.0000 | ORAL_TABLET | ORAL | 0 refills | Status: DC | PRN

## 2016-09-07 MED ORDER — SENNA 8.6 MG PO TABS: 1.0000 | ORAL_TABLET | Freq: Every day | ORAL | 0 refills | Status: DC

## 2016-09-07 NOTE — Progress Notes (Signed)
Patient voided this am. Ambulated in the hallway,tolerated  the activity. Consumed her breakfast, no c/o n/v.

## 2016-09-07 NOTE — Progress Notes (Signed)
Patient d/c home. Stable. 

## 2016-09-07 NOTE — Discharge Summary (Signed)
Physician Discharge Summary  Patient ID: Jennifer Morrison MRN: [id number] DOB/AGE: [birth year] 68 y.o.  Admit date: 09/06/2016 Discharge date: 09/07/2016  Admission Diagnoses: Endometrial cancer [facility])  Discharge Diagnoses:  Principal Problem:   Endometrial cancer [facility])   Discharged Condition: good  Hospital Course:  1/ admitted for robotic assisted total hysterectomy, BSO, SLN biopsy on 09/06/16 for grade 1 endometrial cancer 2/ uncomplicated surgery with no abnormal findings and minimal blood loss 3/ uncomplicated postoperative recovery  4/ met d.c. Criteria on day 1 5/ creatinine 1.3 postop (1.2 preop) - stable  Consults: None  Significant Diagnostic Studies: labs:  CBC    Component Value Date/Time   WBC 11.5 (H) 09/07/2016 0354   RBC 3.83 (L) 09/07/2016 0354   HGB 12.0 09/07/2016 0354   HCT 35.5 (L) 09/07/2016 0354   PLT 333 09/07/2016 0354   MCV 92.7 09/07/2016 0354   MCH 31.3 09/07/2016 0354   MCHC 33.8 09/07/2016 0354   RDW 12.7 09/07/2016 0354   LYMPHSABS 2.2 08/30/2016 1200   MONOABS 0.8 08/30/2016 1200   EOSABS 0.2 08/30/2016 1200   BASOSABS 0.0 08/30/2016 1200   BMET    Component Value Date/Time   NA 136 09/07/2016 0354   K 4.9 09/07/2016 0354   CL 104 09/07/2016 0354   CO2 25 09/07/2016 0354   GLUCOSE 185 (H) 09/07/2016 0354   BUN 20 09/07/2016 0354   CREATININE 1.32 (H) 09/07/2016 0354   CALCIUM 8.6 (L) 09/07/2016 0354   GFRNONAA 40 (L) 09/07/2016 0354   GFRAA 47 (L) 09/07/2016 0354     Treatments: surgery: see above  Discharge Exam: Blood pressure (!) 128/54, pulse 76, temperature 97.5 F (36.4 C), temperature source Oral, resp. rate 17, height _0  (1.575 m), weight 185 lb 5 oz (84.1 kg), SpO2 96 %. General appearance: alert and cooperative Resp: clear to auscultation bilaterally Cardio: regular rate and rhythm, S1, S2 normal, no murmur, click, rub or gallop GI: soft, non-tender; bowel sounds normal; no masses,  no  organomegaly Incision/Wound: clean, dry and in tact  Disposition: 01-Home or Self Care  Discharge Instructions    (HEART FAILURE PATIENTS) Call MD:  Anytime you have any of the following symptoms: 1) 3 pound weight gain in 24 hours or 5 pounds in 1 week 2) shortness of breath, with or without a dry hacking cough 3) swelling in the hands, feet or stomach 4) if you have to sleep on extra pillows at night in order to breathe.    Complete by:  As directed    Call MD for:  difficulty breathing, headache or visual disturbances    Complete by:  As directed    Call MD for:  extreme fatigue    Complete by:  As directed    Call MD for:  hives    Complete by:  As directed    Call MD for:  persistant dizziness or light-headedness    Complete by:  As directed    Call MD for:  persistant nausea and vomiting    Complete by:  As directed    Call MD for:  redness, tenderness, or signs of infection (pain, swelling, redness, odor or green/yellow discharge around incision site)    Complete by:  As directed    Call MD for:  severe uncontrolled pain    Complete by:  As directed    Call MD for:  temperature >100.4    Complete by:  As directed    Diet - low sodium heart  healthy    Complete by:  As directed    Diet general    Complete by:  As directed    Driving Restrictions    Complete by:  As directed    No driving for 7 days or until off narcotic pain medication   Increase activity slowly    Complete by:  As directed    Remove dressing in 24 hours    Complete by:  As directed    Sexual Activity Restrictions    Complete by:  As directed    No intercourse for 6 weeks       Medication List    TAKE these medications   ACCU-CHEK AVIVA PLUS test strip Generic drug:  glucose blood USE AS DIRECTED EVERY DAY   ACCU-CHEK FASTCLIX LANCETS Misc USE AS DIRECTED EVERY DAY   amLODipine 5 MG tablet Commonly known as:  NORVASC Take 5 mg by mouth daily.   aspirin EC 81 MG tablet Take 81 mg by mouth  every evening.   clobetasol cream 0.05 % Commonly known as:  TEMOVATE Apply 1 application topically 2 (two) times daily as needed (eczema).   losartan-hydrochlorothiazide 100-25 MG tablet Commonly known as:  HYZAAR Take 1 tablet by mouth daily.   metFORMIN 500 MG tablet Commonly known as:  GLUCOPHAGE Take 500 mg by mouth 2 (two) times daily.   omeprazole 20 MG capsule Commonly known as:  PRILOSEC Take 20 mg by mouth daily.   oxyCODONE-acetaminophen 5-325 MG tablet Commonly known as:  PERCOCET/ROXICET Take 1-2 tablets by mouth every 4 (four) hours as needed (moderate to severe pain (when tolerating fluids)).   potassium chloride SA 20 MEQ tablet Commonly known as:  K-DUR,KLOR-CON Take 20 mEq by mouth daily. Taking 1/2 tablet since 08/30/16 per PCP orders.   pravastatin 20 MG tablet Commonly known as:  PRAVACHOL Take 20 mg by mouth every evening.   senna 8.6 MG Tabs tablet Commonly known as:  SENOKOT Take 1 tablet (8.6 mg total) by mouth at bedtime. Use while taking percocet or if constipated   traMADol 50 MG tablet Commonly known as:  ULTRAM Take 2 tablets (100 mg total) by mouth every 12 (twelve) hours as needed for moderate pain.      Follow-up Information    [name], MD Follow up on 09/26/2016.   Specialty:  Obstetrics and Gynecology Why:  at 11 am at the [facility] for post-op follow up Contact information: [address] [address] [phone]           Signed: [name] 09/07/2016, 10:48 AM

## 2016-09-07 NOTE — Discharge Instructions (Signed)
09/07/2016  Return to work: 4-6 weeks if applicable  Activity: 1. Be up and out of the bed during the day.  Take a nap if needed.  You may walk up steps but be careful and use the hand rail.  Stair climbing will tire you more than you think, you may need to stop part way and rest.   2. No lifting or straining for 6 weeks.  3. No driving for 1 week(s).  Do not drive if you are taking narcotic pain medicine.  4. Shower daily.  Use soap and water on your incision and pat dry; don't rub.  No tub baths until cleared by your surgeon.   5. No sexual activity and nothing in the vagina for 8 weeks.  6. You may experience a small amount of clear drainage from your incisions, which is normal.  If the drainage persists or increases, please call the office.  7. You may experience vaginal spotting after surgery or around the 6-8 week mark from surgery when the stitches at the top of the vagina begin to dissolve.  The spotting is normal but if you experience heavy bleeding, call our office.  8. You can use tylenol for mild pain as needed.  Diet: 1. Low sodium Heart Healthy Diet is recommended.  2. It is safe to use a laxative, such as Miralax or Colace, if you have difficulty moving your bowels.   Wound Care: 1. Keep clean and dry.  Shower daily.  Reasons to call the Doctor:  Fever - Oral temperature greater than 100.4 degrees Fahrenheit  Foul-smelling vaginal discharge  Difficulty urinating  Nausea and vomiting  Increased pain at the site of the incision that is unrelieved with pain medicine.  Difficulty breathing with or without chest pain  New calf pain especially if only on one side  Sudden, continuing increased vaginal bleeding with or without clots.   Contacts: For questions or concerns you should contact:  Dr. Everitt Amber at [phone]  [name], NP at [phone]  After Hours: call [phone] and have the GYN Oncologist paged/contacted

## 2016-09-07 NOTE — Progress Notes (Signed)
Discharge instructions given. 2 prescriptions handed to patient. Verbalized understanding on her d/c instructions. Port sites to abdomen intact, no drainage. Denies pain. Stable.

## 2016-09-09 ENCOUNTER — Telehealth: Payer: Self-pay | Admitting: Gynecologic Oncology

## 2016-09-09 NOTE — Telephone Encounter (Signed)
Attempted to call patient with path results.  Unable to leave a message.  Will retry at a later time.

## 2016-09-12 ENCOUNTER — Telehealth: Payer: Self-pay | Admitting: Gynecologic Oncology

## 2016-09-12 NOTE — Telephone Encounter (Signed)
Post op telephone call to check patient status.  Patient describes expected post operative status.  Adequate PO intake reported.  Bowels and bladder functioning without difficulty.  Pain minimal.  Reportable signs and symptoms reviewed.  Follow up appt given.  Final path discussed.  Advised to call for any needs or concerns. 

## 2016-09-26 ENCOUNTER — Encounter: Payer: Self-pay | Admitting: Gynecologic Oncology

## 2016-09-26 ENCOUNTER — Ambulatory Visit: Payer: Medicare Other | Attending: Gynecologic Oncology | Admitting: Gynecologic Oncology

## 2016-09-26 VITALS — BP 144/68 | Temp 98.4°F | Resp 18 | Ht 62.0 in | Wt 183.0 lb

## 2016-09-26 DIAGNOSIS — Z7984 Long term (current) use of oral hypoglycemic drugs: Secondary | ICD-10-CM | POA: Insufficient documentation

## 2016-09-26 DIAGNOSIS — C541 Malignant neoplasm of endometrium: Secondary | ICD-10-CM | POA: Diagnosis not present

## 2016-09-26 DIAGNOSIS — E669 Obesity, unspecified: Secondary | ICD-10-CM | POA: Insufficient documentation

## 2016-09-26 DIAGNOSIS — E7800 Pure hypercholesterolemia, unspecified: Secondary | ICD-10-CM | POA: Insufficient documentation

## 2016-09-26 DIAGNOSIS — K219 Gastro-esophageal reflux disease without esophagitis: Secondary | ICD-10-CM | POA: Insufficient documentation

## 2016-09-26 DIAGNOSIS — E119 Type 2 diabetes mellitus without complications: Secondary | ICD-10-CM | POA: Diagnosis not present

## 2016-09-26 DIAGNOSIS — Z7982 Long term (current) use of aspirin: Secondary | ICD-10-CM | POA: Diagnosis not present

## 2016-09-26 DIAGNOSIS — I10 Essential (primary) hypertension: Secondary | ICD-10-CM | POA: Diagnosis not present

## 2016-09-26 DIAGNOSIS — Z79899 Other long term (current) drug therapy: Secondary | ICD-10-CM | POA: Insufficient documentation

## 2016-09-26 NOTE — Patient Instructions (Signed)
Follow up in 1 year ( December 2018) call in June to schedule appointment.

## 2016-09-26 NOTE — Progress Notes (Signed)
Follow-up Note: Gyn-Onc  Consult was requested by Dr. Nori Riis for the evaluation of SOMONE Morrison 70 y.o. female  CC:  Chief Complaint  Patient presents with  . uterine cancer    Assessment/Plan:  [name]  is a 70 y.o.  year old with stage IA grade 1 endometrioid endometrial cancer s/p robotic assisted hysterectomy, BSO and staging on 09/06/16.  Pathology revealed low risk factors for recurrence, therefore no adjuvant therapy is recommended according to NCCN guidelines.  I discussed risk for recurrence and typical symptoms encouraged her to notify us of these should they develop between visits.  I recommend she have follow-up every 6 months for 5 years in accordance with NCCN guidelines. Those visits should include symptom assessment, physical exam and pelvic examination. Pap smears are not indicated or recommended in the routine surveillance of endometrial cancer.  She will follow-up with Dr Nori Riis in 6 months and with me in 12 months.  HPI: Jennifer Morrison is a 70 year old G2P2 who is seen in consultation at the request of Dr Nori Riis for endometrial cancer.    The patient reports a single episode of postmenopausal bleeding in September, 2017. Dr Nori Riis performed an [place] on 07/14/16 which showed a 5.6x3.1x3.8cm uterus with a 1.4cm endometrial stripe (no adnexal masses). A hysteroscopy and D&C was performed on 08/03/16 which revealed endometrial adenocarcinoma associated with CAH (FIGO grade 1).  The patient is obese and has DM (HbA1C 6%) on metformin. The patient has HTN.  Interval Hx:  The patient underwent robotic assisted total hysterectomy, BSO and SLN biopsy on 09/06/16. Surgery was uncomplicated. Final pathology revealed a grade 1 cancer with no myometrial invasion. It measured 2.6cm. No LVSI, cervical involvement and adnexal or nodal involvement was seen. In accordance with NCCN guidelines no adjuvant therapy was recommended. Postop she has done well with no complaints.     Current Meds:  Outpatient Encounter Prescriptions as of 09/26/2016  Medication Sig  . ACCU-CHEK AVIVA PLUS test strip USE AS DIRECTED EVERY DAY  . ACCU-CHEK FASTCLIX LANCETS MISC USE AS DIRECTED EVERY DAY  . amLODipine (NORVASC) 5 MG tablet Take 5 mg by mouth daily.  . clobetasol cream (TEMOVATE) [id number] % Apply 1 application topically 2 (two) times daily as needed (eczema).  . losartan-hydrochlorothiazide (HYZAAR) 100-25 MG per tablet Take 1 tablet by mouth daily.  . metFORMIN (GLUCOPHAGE) 500 MG tablet Take 500 mg by mouth 2 (two) times daily.   Marland Kitchen omeprazole (PRILOSEC) 20 MG capsule Take 20 mg by mouth daily.  . potassium chloride SA (K-DUR,KLOR-CON) 20 MEQ tablet Take 20 mEq by mouth daily. Taking 1/2 tablet since 08/30/16 per PCP orders.  . pravastatin (PRAVACHOL) 20 MG tablet Take 20 mg by mouth every evening.   Marland Kitchen aspirin EC 81 MG tablet Take 81 mg by mouth every evening.  Marland Kitchen oxyCODONE-acetaminophen (PERCOCET/ROXICET) 5-325 MG tablet Take 1-2 tablets by mouth every 4 (four) hours as needed (moderate to severe pain (when tolerating fluids)). (Patient not taking: Reported on 09/26/2016)  . senna (SENOKOT) 8.6 MG TABS tablet Take 1 tablet (8.6 mg total) by mouth at bedtime. Use while taking percocet or if constipated (Patient not taking: Reported on 09/26/2016)  . traMADol (ULTRAM) 50 MG tablet Take 2 tablets (100 mg total) by mouth every 12 (twelve) hours as needed for moderate pain. (Patient not taking: Reported on 09/26/2016)   No facility-administered encounter medications on file as of 09/26/2016.     Allergy: No Known Allergies  Social Hx:  Social History   Social History  . Marital status: Married    Spouse name: N/A  . Number of children: N/A  . Years of education: N/A   Occupational History  . Not on file.   Social History Main Topics  . Smoking status: Never Smoker  . Smokeless tobacco: Never Used  . Alcohol use No  . Drug use: No  . Sexual activity: Yes    Other Topics Concern  . Not on file   Social History Narrative  . No narrative on file    Past Surgical Hx:  Past Surgical History:  Procedure Laterality Date  . COLONOSCOPY  2005  . COLONOSCOPY N/A 11/28/2013   Procedure: COLONOSCOPY;  Surgeon: Rogene Houston, MD;  [facility];  Service: Endoscopy;  Laterality: N/A;  830  . Polyp removed from uterus    . ROBOTIC ASSISTED LAP VAGINAL HYSTERECTOMY N/A 09/06/2016   Procedure: XI ROBOTIC ASSISTED LAPAROSCOPIC TOTAL HYSTERECTOMY;  Surgeon: Everitt Amber, MD;  Location: [facility];  Service: Gynecology;  Laterality: N/A;  . ROBOTIC ASSISTED SALPINGO OOPHERECTOMY Bilateral 09/06/2016   Procedure: XI ROBOTIC ASSISTED SALPINGO OOPHORECTOMY;  Surgeon: Everitt Amber, MD;  Location: [facility];  Service: Gynecology;  Laterality: Bilateral;  . SENTINEL NODE BIOPSY N/A 09/06/2016   Procedure: SENTINEL NODE BIOPSY;  Surgeon: Everitt Amber, MD;  Location: [facility];  Service: Gynecology;  Laterality: N/A;    Past Medical Hx:  Past Medical History:  Diagnosis Date  . Diabetes mellitus without complication ([facility])    NIDDM x 1 yr  . GERD (gastroesophageal reflux disease)   . Hypercholesteremia   . Hypertension   . Uterine cancer ([place]) 08/14/2016    Past Gynecological History:  G2P2 SVD x 2 No LMP recorded. Patient is postmenopausal.  Family Hx:  Family History  Problem Relation Age of Onset  . Diabetes Father   . Diabetes Brother   . Colon cancer Neg Hx     Review of Systems:  Constitutional  Feels well,    ENT Normal appearing ears and nares bilaterally Skin/Breast  No rash, sores, jaundice, itching, dryness Cardiovascular  No chest pain, shortness of breath, or edema  Pulmonary  No cough or wheeze.  Gastro Intestinal  No nausea, vomitting, or diarrhoea. No bright red blood per rectum, no abdominal pain, change in bowel movement, or constipation.  Genito Urinary  No frequency, urgency, dysuria, + postmenopausal bleeding Musculo  Skeletal  No myalgia, arthralgia, joint swelling or pain  Neurologic  No weakness, numbness, change in gait,  Psychology  No depression, anxiety, insomnia.   Vitals:  Blood pressure (!) 144/68, temperature 98.4 F (36.9 C), resp. rate 18, height 5\' 2"  (1.575 m), weight 183 lb (83 kg), SpO2 98 %.  Physical Exam: WD in NAD Neck  Supple NROM, without any enlargements.  Lymph Node Survey No cervical supraclavicular or inguinal adenopathy Cardiovascular  Pulse normal rate, regularity and rhythm. S1 and S2 normal.  Lungs  Clear to auscultation bilateraly, without wheezes/crackles/rhonchi. Good air movement.  Skin  No rash/lesions/breakdown  Psychiatry  Alert and oriented to person, place, and time  Abdomen  Normoactive bowel sounds, abdomen soft, non-tender and obese without evidence of hernia. Well healed incisions. Back No CVA tenderness Genito Urinary: surgically absent uterus cervix. Vaginal cuff healing normally. No lesions. Rectal  deferred Extremities  No bilateral cyanosis, clubbing or edema.   20 minutes of direct face to face counseling time was spent with the patient. This included discussion about prognosis,  therapy recommendations and postoperative side effects and are beyond the scope of routine postoperative care.   [name], MD  09/26/2016, 11:51 AM

## 2016-12-13 DIAGNOSIS — E119 Type 2 diabetes mellitus without complications: Secondary | ICD-10-CM | POA: Diagnosis not present

## 2016-12-13 DIAGNOSIS — H5213 Myopia, bilateral: Secondary | ICD-10-CM | POA: Diagnosis not present

## 2016-12-13 DIAGNOSIS — H2513 Age-related nuclear cataract, bilateral: Secondary | ICD-10-CM | POA: Diagnosis not present

## 2016-12-13 DIAGNOSIS — H52203 Unspecified astigmatism, bilateral: Secondary | ICD-10-CM | POA: Diagnosis not present

## 2016-12-13 DIAGNOSIS — H524 Presbyopia: Secondary | ICD-10-CM | POA: Diagnosis not present

## 2017-01-04 DIAGNOSIS — I10 Essential (primary) hypertension: Secondary | ICD-10-CM | POA: Diagnosis not present

## 2017-01-04 DIAGNOSIS — Z79899 Other long term (current) drug therapy: Secondary | ICD-10-CM | POA: Diagnosis not present

## 2017-01-04 DIAGNOSIS — E119 Type 2 diabetes mellitus without complications: Secondary | ICD-10-CM | POA: Diagnosis not present

## 2017-01-11 DIAGNOSIS — I10 Essential (primary) hypertension: Secondary | ICD-10-CM | POA: Diagnosis not present

## 2017-01-11 DIAGNOSIS — E119 Type 2 diabetes mellitus without complications: Secondary | ICD-10-CM | POA: Diagnosis not present

## 2017-01-11 DIAGNOSIS — N183 Chronic kidney disease, stage 3 (moderate): Secondary | ICD-10-CM | POA: Diagnosis not present

## 2017-01-24 DIAGNOSIS — L308 Other specified dermatitis: Secondary | ICD-10-CM | POA: Diagnosis not present

## 2017-03-27 DIAGNOSIS — N840 Polyp of corpus uteri: Secondary | ICD-10-CM | POA: Diagnosis not present

## 2017-04-26 ENCOUNTER — Telehealth: Payer: Self-pay | Admitting: *Deleted

## 2017-04-26 NOTE — Telephone Encounter (Signed)
Patient called and scheduled a follow p appt for December 17th at 2:30pm; arrive at 2pm

## 2017-07-17 DIAGNOSIS — Z1231 Encounter for screening mammogram for malignant neoplasm of breast: Secondary | ICD-10-CM | POA: Diagnosis not present

## 2017-07-24 DIAGNOSIS — Z79899 Other long term (current) drug therapy: Secondary | ICD-10-CM | POA: Diagnosis not present

## 2017-07-24 DIAGNOSIS — E119 Type 2 diabetes mellitus without complications: Secondary | ICD-10-CM | POA: Diagnosis not present

## 2017-07-24 DIAGNOSIS — K219 Gastro-esophageal reflux disease without esophagitis: Secondary | ICD-10-CM | POA: Diagnosis not present

## 2017-07-24 DIAGNOSIS — I10 Essential (primary) hypertension: Secondary | ICD-10-CM | POA: Diagnosis not present

## 2017-08-01 DIAGNOSIS — Z0001 Encounter for general adult medical examination with abnormal findings: Secondary | ICD-10-CM | POA: Diagnosis not present

## 2017-08-01 DIAGNOSIS — E1122 Type 2 diabetes mellitus with diabetic chronic kidney disease: Secondary | ICD-10-CM | POA: Diagnosis not present

## 2017-08-01 DIAGNOSIS — Z23 Encounter for immunization: Secondary | ICD-10-CM | POA: Diagnosis not present

## 2017-08-01 DIAGNOSIS — I10 Essential (primary) hypertension: Secondary | ICD-10-CM | POA: Diagnosis not present

## 2017-08-01 DIAGNOSIS — N183 Chronic kidney disease, stage 3 (moderate): Secondary | ICD-10-CM | POA: Diagnosis not present

## 2017-09-25 ENCOUNTER — Encounter: Payer: Self-pay | Admitting: Gynecologic Oncology

## 2017-09-25 ENCOUNTER — Ambulatory Visit: Payer: Medicare Other | Attending: Gynecologic Oncology | Admitting: Gynecologic Oncology

## 2017-09-25 VITALS — BP 144/75 | HR 88 | Temp 98.0°F | Resp 18 | Ht 62.0 in | Wt 193.0 lb

## 2017-09-25 DIAGNOSIS — E669 Obesity, unspecified: Secondary | ICD-10-CM | POA: Diagnosis not present

## 2017-09-25 DIAGNOSIS — C541 Malignant neoplasm of endometrium: Secondary | ICD-10-CM | POA: Insufficient documentation

## 2017-09-25 DIAGNOSIS — Z7982 Long term (current) use of aspirin: Secondary | ICD-10-CM | POA: Diagnosis not present

## 2017-09-25 DIAGNOSIS — E119 Type 2 diabetes mellitus without complications: Secondary | ICD-10-CM | POA: Insufficient documentation

## 2017-09-25 DIAGNOSIS — Z8542 Personal history of malignant neoplasm of other parts of uterus: Secondary | ICD-10-CM | POA: Diagnosis not present

## 2017-09-25 DIAGNOSIS — I10 Essential (primary) hypertension: Secondary | ICD-10-CM | POA: Diagnosis not present

## 2017-09-25 DIAGNOSIS — Z90710 Acquired absence of both cervix and uterus: Secondary | ICD-10-CM | POA: Diagnosis not present

## 2017-09-25 DIAGNOSIS — Z6835 Body mass index (BMI) 35.0-35.9, adult: Secondary | ICD-10-CM | POA: Insufficient documentation

## 2017-09-25 DIAGNOSIS — Z7984 Long term (current) use of oral hypoglycemic drugs: Secondary | ICD-10-CM | POA: Diagnosis not present

## 2017-09-25 DIAGNOSIS — Z79899 Other long term (current) drug therapy: Secondary | ICD-10-CM | POA: Insufficient documentation

## 2017-09-25 DIAGNOSIS — K219 Gastro-esophageal reflux disease without esophagitis: Secondary | ICD-10-CM | POA: Diagnosis not present

## 2017-09-25 DIAGNOSIS — E7800 Pure hypercholesterolemia, unspecified: Secondary | ICD-10-CM | POA: Insufficient documentation

## 2017-09-25 NOTE — Progress Notes (Signed)
Follow-up Note: Gyn-Onc  Consult was requested by Dr. Nori Riis for the evaluation of Jennifer Morrison 71 y.o. female  CC:  Chief Complaint  Patient presents with  . Endometrial cancer [facility])    Assessment/Plan:  Jennifer Morrison  is a 71 y.o.  year old with stage IA grade 1 endometrioid endometrial cancer s/p robotic assisted hysterectomy, BSO and staging on 09/06/16.  Pathology revealed low risk factors for recurrence, therefore no adjuvant therapy is recommended according to NCCN guidelines.  I discussed risk for recurrence and typical symptoms encouraged her to notify us of these should they develop between visits.  I recommend she have follow-up every 6 months for 5 years in accordance with NCCN guidelines. Those visits should include symptom assessment, physical exam and pelvic examination. Pap smears are not indicated or recommended in the routine surveillance of endometrial cancer.  She will follow-up with me in 6 months as Dr Nori Riis is retiring.  HPI: Jennifer Morrison is a 71 year old G2P2 who is seen in consultation at the request of Dr Nori Riis for endometrial cancer.    The patient reports a single episode of postmenopausal bleeding in September, 2017. Dr Nori Riis performed an [place] on 07/14/16 which showed a 5.6x3.1x3.8cm uterus with a 1.4cm endometrial stripe (no adnexal masses). A hysteroscopy and D&C was performed on 08/03/16 which revealed endometrial adenocarcinoma associated with CAH (FIGO grade 1).  The patient is obese and has DM (HbA1C 6%) on metformin. The patient has HTN.  Interval Hx:  The patient underwent robotic assisted total hysterectomy, BSO and SLN biopsy on 09/06/16. Surgery was uncomplicated. Final pathology revealed a grade 1 cancer with no myometrial invasion. It measured 2.6cm. No LVSI, cervical involvement and adnexal or nodal involvement was seen. In accordance with NCCN guidelines no adjuvant therapy was recommended.  She has no symptoms concerning for  recurrence.   Current Meds:  Outpatient Encounter Medications as of 09/25/2017  Medication Sig  . ACCU-CHEK AVIVA PLUS test strip USE AS DIRECTED EVERY DAY  . ACCU-CHEK FASTCLIX LANCETS MISC USE AS DIRECTED EVERY DAY  . amLODipine (NORVASC) 5 MG tablet Take 5 mg by mouth daily.  Marland Kitchen aspirin EC 81 MG tablet Take 81 mg by mouth every evening.  . clobetasol cream (TEMOVATE) 0.16 % Apply 1 application topically 2 (two) times daily as needed (eczema).  . losartan-hydrochlorothiazide (HYZAAR) 100-25 MG per tablet Take 1 tablet by mouth daily.  . metFORMIN (GLUCOPHAGE) 500 MG tablet Take 500 mg by mouth 2 (two) times daily.   Marland Kitchen omeprazole (PRILOSEC) 20 MG capsule Take 20 mg by mouth daily.  . pravastatin (PRAVACHOL) 20 MG tablet Take 20 mg by mouth every evening.   . [DISCONTINUED] oxyCODONE-acetaminophen (PERCOCET/ROXICET) 5-325 MG tablet Take 1-2 tablets by mouth every 4 (four) hours as needed (moderate to severe pain (when tolerating fluids)). (Patient not taking: Reported on 09/26/2016)  . [DISCONTINUED] potassium chloride SA (K-DUR,KLOR-CON) 20 MEQ tablet Take 20 mEq by mouth daily. Taking 1/2 tablet since 08/30/16 per PCP orders.  . [DISCONTINUED] senna (SENOKOT) 8.6 MG TABS tablet Take 1 tablet (8.6 mg total) by mouth at bedtime. Use while taking percocet or if constipated (Patient not taking: Reported on 09/26/2016)  . [DISCONTINUED] traMADol (ULTRAM) 50 MG tablet Take 2 tablets (100 mg total) by mouth every 12 (twelve) hours as needed for moderate pain. (Patient not taking: Reported on 09/26/2016)   No facility-administered encounter medications on file as of 09/25/2017.     Allergy: No Known Allergies  Social Hx:   Social History   Socioeconomic History  . Marital status: Married    Spouse name: Not on file  . Number of children: Not on file  . Years of education: Not on file  . Highest education level: Not on file  Social Needs  . Financial resource strain: Not on file  . Food  insecurity - worry: Not on file  . Food insecurity - inability: Not on file  . Transportation needs - medical: Not on file  . Transportation needs - non-medical: Not on file  Occupational History  . Not on file  Tobacco Use  . Smoking status: Never Smoker  . Smokeless tobacco: Never Used  Substance and Sexual Activity  . Alcohol use: No  . Drug use: No  . Sexual activity: Yes  Other Topics Concern  . Not on file  Social History Narrative  . Not on file    Past Surgical Hx:  Past Surgical History:  Procedure Laterality Date  . COLONOSCOPY  2005  . COLONOSCOPY N/A 11/28/2013   Procedure: COLONOSCOPY;  Surgeon: Rogene Houston, MD;  [facility];  Service: Endoscopy;  Laterality: N/A;  830  . Polyp removed from uterus    . ROBOTIC ASSISTED LAP VAGINAL HYSTERECTOMY N/A 09/06/2016   Procedure: XI ROBOTIC ASSISTED LAPAROSCOPIC TOTAL HYSTERECTOMY;  Surgeon: Everitt Amber, MD;  Location: [facility];  Service: Gynecology;  Laterality: N/A;  . ROBOTIC ASSISTED SALPINGO OOPHERECTOMY Bilateral 09/06/2016   Procedure: XI ROBOTIC ASSISTED SALPINGO OOPHORECTOMY;  Surgeon: Everitt Amber, MD;  Location: [facility];  Service: Gynecology;  Laterality: Bilateral;  . SENTINEL NODE BIOPSY N/A 09/06/2016   Procedure: SENTINEL NODE BIOPSY;  Surgeon: Everitt Amber, MD;  Location: [facility];  Service: Gynecology;  Laterality: N/A;    Past Medical Hx:  Past Medical History:  Diagnosis Date  . Diabetes mellitus without complication (HCC)    NIDDM x 1 yr  . GERD (gastroesophageal reflux disease)   . Hypercholesteremia   . Hypertension   . Uterine cancer ([place]) 08/14/2016    Past Gynecological History:  G2P2 SVD x 2 No LMP recorded. Patient is postmenopausal.  Family Hx:  Family History  Problem Relation Age of Onset  . Diabetes Father   . Diabetes Brother   . Colon cancer Neg Hx     Review of Systems:  Constitutional  Feels well,    ENT Normal appearing ears and nares bilaterally Skin/Breast   No rash, sores, jaundice, itching, dryness Cardiovascular  No chest pain, shortness of breath, or edema  Pulmonary  No cough or wheeze.  Gastro Intestinal  No nausea, vomitting, or diarrhoea. No bright red blood per rectum, no abdominal pain, change in bowel movement, or constipation.  Genito Urinary  No frequency, urgency, dysuria, no postmenopausal bleeding Musculo Skeletal  No myalgia, arthralgia, joint swelling or pain  Neurologic  No weakness, numbness, change in gait,  Psychology  No depression, anxiety, insomnia.   Vitals:  Blood pressure (!) 144/75, pulse 88, temperature 98 F (36.7 C), temperature source Oral, resp. rate 18, height 5\' 2"  (1.575 m), weight 193 lb (87.5 kg), SpO2 99 %.  Physical Exam: WD in NAD Neck  Supple NROM, without any enlargements.  Lymph Node Survey No cervical supraclavicular or inguinal adenopathy Cardiovascular  Pulse normal rate, regularity and rhythm. S1 and S2 normal.  Lungs  Clear to auscultation bilateraly, without wheezes/crackles/rhonchi. Good air movement.  Skin  No rash/lesions/breakdown  Psychiatry  Alert and oriented to person, place,  and time  Abdomen  Normoactive bowel sounds, abdomen soft, non-tender and obese without evidence of hernia. Well healed incisions. Back No CVA tenderness Genito Urinary: surgically absent uterus cervix. Vaginal cuff healing normally. No lesions. Rectal  deferred Extremities  No bilateral cyanosis, clubbing or edema.   [name], MD  09/25/2017, 3:14 PM

## 2017-09-25 NOTE — Patient Instructions (Signed)
Please notify Dr Malya Cirillo at phone number [phone] if you notice vaginal bleeding, new pelvic or abdominal pains, bloating, feeling full easy, or a change in bladder or bowel function.  Please return to see Dr Rolf Fells in 6 months.  

## 2017-11-21 DIAGNOSIS — Z79899 Other long term (current) drug therapy: Secondary | ICD-10-CM | POA: Diagnosis not present

## 2017-11-21 DIAGNOSIS — I10 Essential (primary) hypertension: Secondary | ICD-10-CM | POA: Diagnosis not present

## 2017-11-21 DIAGNOSIS — N183 Chronic kidney disease, stage 3 (moderate): Secondary | ICD-10-CM | POA: Diagnosis not present

## 2017-11-21 DIAGNOSIS — E119 Type 2 diabetes mellitus without complications: Secondary | ICD-10-CM | POA: Diagnosis not present

## 2017-11-21 DIAGNOSIS — K219 Gastro-esophageal reflux disease without esophagitis: Secondary | ICD-10-CM | POA: Diagnosis not present

## 2017-11-28 DIAGNOSIS — E119 Type 2 diabetes mellitus without complications: Secondary | ICD-10-CM | POA: Diagnosis not present

## 2017-11-28 DIAGNOSIS — I10 Essential (primary) hypertension: Secondary | ICD-10-CM | POA: Diagnosis not present

## 2017-11-28 DIAGNOSIS — N183 Chronic kidney disease, stage 3 (moderate): Secondary | ICD-10-CM | POA: Diagnosis not present

## 2017-12-12 DIAGNOSIS — L570 Actinic keratosis: Secondary | ICD-10-CM | POA: Diagnosis not present

## 2017-12-12 DIAGNOSIS — D225 Melanocytic nevi of trunk: Secondary | ICD-10-CM | POA: Diagnosis not present

## 2017-12-12 DIAGNOSIS — X32XXXA Exposure to sunlight, initial encounter: Secondary | ICD-10-CM | POA: Diagnosis not present

## 2017-12-12 DIAGNOSIS — Z1283 Encounter for screening for malignant neoplasm of skin: Secondary | ICD-10-CM | POA: Diagnosis not present

## 2017-12-12 DIAGNOSIS — D485 Neoplasm of uncertain behavior of skin: Secondary | ICD-10-CM | POA: Diagnosis not present

## 2017-12-26 DIAGNOSIS — D485 Neoplasm of uncertain behavior of skin: Secondary | ICD-10-CM | POA: Diagnosis not present

## 2017-12-26 DIAGNOSIS — L988 Other specified disorders of the skin and subcutaneous tissue: Secondary | ICD-10-CM | POA: Diagnosis not present

## 2018-03-13 DIAGNOSIS — I10 Essential (primary) hypertension: Secondary | ICD-10-CM | POA: Diagnosis not present

## 2018-03-13 DIAGNOSIS — Z79899 Other long term (current) drug therapy: Secondary | ICD-10-CM | POA: Diagnosis not present

## 2018-03-13 DIAGNOSIS — E1129 Type 2 diabetes mellitus with other diabetic kidney complication: Secondary | ICD-10-CM | POA: Diagnosis not present

## 2018-03-13 DIAGNOSIS — N183 Chronic kidney disease, stage 3 (moderate): Secondary | ICD-10-CM | POA: Diagnosis not present

## 2018-03-14 ENCOUNTER — Telehealth: Payer: Self-pay | Admitting: *Deleted

## 2018-03-14 NOTE — Telephone Encounter (Signed)
Called and spoke with the patient, moved appt from June 19th to June 10th

## 2018-03-19 ENCOUNTER — Inpatient Hospital Stay: Payer: Medicare Other | Attending: Gynecologic Oncology | Admitting: Gynecologic Oncology

## 2018-03-19 ENCOUNTER — Ambulatory Visit: Payer: Medicare Other | Admitting: Gynecologic Oncology

## 2018-03-19 ENCOUNTER — Encounter: Payer: Self-pay | Admitting: Gynecologic Oncology

## 2018-03-19 VITALS — BP 143/57 | HR 79 | Temp 98.1°F | Resp 20 | Ht 62.0 in | Wt 192.3 lb

## 2018-03-19 DIAGNOSIS — C541 Malignant neoplasm of endometrium: Secondary | ICD-10-CM | POA: Diagnosis present

## 2018-03-19 DIAGNOSIS — Z90722 Acquired absence of ovaries, bilateral: Secondary | ICD-10-CM | POA: Diagnosis not present

## 2018-03-19 DIAGNOSIS — Z90710 Acquired absence of both cervix and uterus: Secondary | ICD-10-CM | POA: Insufficient documentation

## 2018-03-19 NOTE — Patient Instructions (Signed)
Please notify Dr Seidy Labreck at phone number [phone] if you notice vaginal bleeding, new pelvic or abdominal pains, bloating, feeling full easy, or a change in bladder or bowel function.   Please return to see Dr Rosabell Geyer in December, 2019.  

## 2018-03-19 NOTE — Progress Notes (Signed)
Follow-up Note: Gyn-Onc  Consult was requested by Dr. Nori Riis for the evaluation of Jennifer Morrison 72 y.o. female  CC:  Chief Complaint  Patient presents with  . Endometrial cancer [facility])    Assessment/Plan:  Jennifer Morrison  is a 72 y.o.  year old with stage IA grade 1 endometrioid endometrial cancer s/p robotic assisted hysterectomy, BSO and staging on 09/06/16.  Pathology revealed low risk factors for recurrence, therefore no adjuvant therapy is recommended according to NCCN guidelines.  I discussed risk for recurrence and typical symptoms encouraged her to notify us of these should they develop between visits.  I recommend she continue follow-up every 6 months for 5 years in accordance with NCCN guidelines. Those visits should include symptom assessment, physical exam and pelvic examination. Pap smears are not indicated or recommended in the routine surveillance of endometrial cancer.  She will follow-up with me in 6 months as Dr Nori Riis is retiring.  HPI: Jennifer Morrison is a 72 year old G2P2 who is seen in consultation at the request of Dr Nori Riis for endometrial cancer.    The patient reports a single episode of postmenopausal bleeding in September, 2017. Dr Nori Riis performed an [place] on 07/14/16 which showed a 5.6x3.1x3.8cm uterus with a 1.4cm endometrial stripe (no adnexal masses). A hysteroscopy and D&C was performed on 08/03/16 which revealed endometrial adenocarcinoma associated with CAH (FIGO grade 1).  The patient is obese and has DM (HbA1C 6%) on metformin. The patient has HTN.  Interval Hx:  The patient underwent robotic assisted total hysterectomy, BSO and SLN biopsy on 09/06/16. Surgery was uncomplicated. Final pathology revealed a grade 1 cancer with no myometrial invasion. It measured 2.6cm. No LVSI, cervical involvement and adnexal or nodal involvement was seen. In accordance with NCCN guidelines no adjuvant therapy was recommended.  She has no symptoms concerning for  recurrence. She has had a mole removed concerning for melanoma and has established close follow-up for this with her dermatologist.    Current Meds:  Outpatient Encounter Medications as of 03/19/2018  Medication Sig  . ACCU-CHEK AVIVA PLUS test strip USE AS DIRECTED EVERY DAY  . ACCU-CHEK FASTCLIX LANCETS MISC USE AS DIRECTED EVERY DAY  . amLODipine (NORVASC) 5 MG tablet Take 5 mg by mouth daily.  Marland Kitchen aspirin EC 81 MG tablet Take 81 mg by mouth every evening.  . clobetasol cream (TEMOVATE) 1.61 % Apply 1 application topically 2 (two) times daily as needed (eczema).  . losartan-hydrochlorothiazide (HYZAAR) 100-25 MG per tablet Take 1 tablet by mouth daily.  . metFORMIN (GLUCOPHAGE) 500 MG tablet Take 500 mg by mouth 2 (two) times daily.   Marland Kitchen omeprazole (PRILOSEC) 20 MG capsule Take 20 mg by mouth daily.  . pravastatin (PRAVACHOL) 20 MG tablet Take 20 mg by mouth every evening.    No facility-administered encounter medications on file as of 03/19/2018.     Allergy: No Known Allergies  Social Hx:   Social History   Socioeconomic History  . Marital status: Married    Spouse name: Not on file  . Number of children: Not on file  . Years of education: Not on file  . Highest education level: Not on file  Occupational History  . Not on file  Social Needs  . Financial resource strain: Not on file  . Food insecurity:    Worry: Not on file    Inability: Not on file  . Transportation needs:    Medical: Not on file    Non-medical:  Not on file  Tobacco Use  . Smoking status: Never Smoker  . Smokeless tobacco: Never Used  Substance and Sexual Activity  . Alcohol use: No  . Drug use: No  . Sexual activity: Yes  Lifestyle  . Physical activity:    Days per week: Not on file    Minutes per session: Not on file  . Stress: Not on file  Relationships  . Social connections:    Talks on phone: Not on file    Gets together: Not on file    Attends religious service: Not on file    Active  member of club or organization: Not on file    Attends meetings of clubs or organizations: Not on file    Relationship status: Not on file  . Intimate partner violence:    Fear of current or ex partner: Not on file    Emotionally abused: Not on file    Physically abused: Not on file    Forced sexual activity: Not on file  Other Topics Concern  . Not on file  Social History Narrative  . Not on file    Past Surgical Hx:  Past Surgical History:  Procedure Laterality Date  . COLONOSCOPY  2005  . COLONOSCOPY N/A 11/28/2013   Procedure: COLONOSCOPY;  Surgeon: Rogene Houston, MD;  [facility];  Service: Endoscopy;  Laterality: N/A;  830  . Polyp removed from uterus    . ROBOTIC ASSISTED LAP VAGINAL HYSTERECTOMY N/A 09/06/2016   Procedure: XI ROBOTIC ASSISTED LAPAROSCOPIC TOTAL HYSTERECTOMY;  Surgeon: Everitt Amber, MD;  [facility];  Service: Gynecology;  Laterality: N/A;  . ROBOTIC ASSISTED SALPINGO OOPHERECTOMY Bilateral 09/06/2016   Procedure: XI ROBOTIC ASSISTED SALPINGO OOPHORECTOMY;  Surgeon: Everitt Amber, MD;  Location: [facility];  Service: Gynecology;  Laterality: Bilateral;  . SENTINEL NODE BIOPSY N/A 09/06/2016   Procedure: SENTINEL NODE BIOPSY;  Surgeon: Everitt Amber, MD;  Location[facility];  Service: Gynecology;  Laterality: N/A;    Past Medical Hx:  Past Medical History:  Diagnosis Date  . Diabetes mellitus without complication (HCC)    NIDDM x 1 yr  . GERD (gastroesophageal reflux disease)   . Hypercholesteremia   . Hypertension   . Uterine cancer ([place]) 08/14/2016    Past Gynecological History:  G2P2 SVD x 2 No LMP recorded. Patient is postmenopausal.  Family Hx:  Family History  Problem Relation Age of Onset  . Diabetes Father   . Diabetes Brother   . Colon cancer Neg Hx     Review of Systems:  Constitutional  Feels well,    ENT Normal appearing ears and nares bilaterally Skin/Breast  No rash, sores, jaundice, itching, dryness Cardiovascular   No chest pain, shortness of breath, or edema  Pulmonary  No cough or wheeze.  Gastro Intestinal  No nausea, vomitting, or diarrhoea. No bright red blood per rectum, no abdominal pain, change in bowel movement, or constipation.  Genito Urinary  No frequency, urgency, dysuria, no postmenopausal bleeding Musculo Skeletal  No myalgia, arthralgia, joint swelling or pain  Neurologic  No weakness, numbness, change in gait,  Psychology  No depression, anxiety, insomnia.   Vitals:  Blood pressure (!) 143/57, pulse 79, temperature 98.1 F (36.7 C), temperature source Oral, resp. rate 20, height 5\' 2"  (1.575 m), weight 192 lb 4.8 oz (87.2 kg), SpO2 100 %.  Physical Exam: WD in NAD Neck  Supple NROM, without any enlargements.  Lymph Node Survey No cervical supraclavicular or inguinal  adenopathy Cardiovascular  Pulse normal rate, regularity and rhythm. S1 and S2 normal.  Lungs  Clear to auscultation bilateraly, without wheezes/crackles/rhonchi. Good air movement.  Skin  No rash/lesions/breakdown  Psychiatry  Alert and oriented to person, place, and time  Abdomen  Normoactive bowel sounds, abdomen soft, non-tender and obese without evidence of hernia. Well healed incisions. Back No CVA tenderness Genito Urinary: surgically absent uterus cervix. Vaginal cuff healing normally. No lesions. Rectal  deferred Extremities  No bilateral cyanosis, clubbing or edema.   Thereasa Solo, MD  03/19/2018, 1:46 PM

## 2018-03-20 DIAGNOSIS — E1129 Type 2 diabetes mellitus with other diabetic kidney complication: Secondary | ICD-10-CM | POA: Diagnosis not present

## 2018-03-20 DIAGNOSIS — N183 Chronic kidney disease, stage 3 (moderate): Secondary | ICD-10-CM | POA: Diagnosis not present

## 2018-03-28 ENCOUNTER — Ambulatory Visit: Payer: Medicare Other | Admitting: Gynecologic Oncology

## 2018-05-14 DIAGNOSIS — H25813 Combined forms of age-related cataract, bilateral: Secondary | ICD-10-CM | POA: Diagnosis not present

## 2018-05-14 DIAGNOSIS — H25811 Combined forms of age-related cataract, right eye: Secondary | ICD-10-CM | POA: Diagnosis not present

## 2018-05-29 NOTE — Patient Instructions (Signed)
Jennifer Morrison  05/29/2018     @PREFPERIOPPHARMACY @   Your procedure is scheduled on 06/04/2018.  Report to [facility] at 9:00 A.M.  Call this number if you have problems the morning of surgery:  [phone]   Remember:  Do not eat or drink after midnight.     Take these medicines the morning of surgery with A SIP OF WATER Amlodipine, Prilosec  NO DIABETIC MEDICATIONS THAT MORNING OF PROCEDURE    Do not wear jewelry, make-up or nail polish.  Do not wear lotions, powders, or perfumes, or deodorant.  Do not shave 48 hours prior to surgery.  Men may shave face and neck.  Do not bring valuables to the hospital.  [facility] is not responsible for any belongings or valuables.  Contacts, dentures or bridgework may not be worn into surgery.  Leave your suitcase in the car.  After surgery it may be brought to your room.  For patients admitted to the hospital, discharge time will be determined by your treatment team.  Patients discharged the day of surgery will not be allowed to drive home.    Please read over the following fact sheets that you were given. Anesthesia Post-op Instructions     PATIENT INSTRUCTIONS POST-ANESTHESIA  IMMEDIATELY FOLLOWING SURGERY:  Do not drive or operate machinery for the first twenty four hours after surgery.  Do not make any important decisions for twenty four hours after surgery or while taking narcotic pain medications or sedatives.  If you develop intractable nausea and vomiting or a severe headache please notify your doctor immediately.  FOLLOW-UP:  Please make an appointment with your surgeon as instructed. You do not need to follow up with anesthesia unless specifically instructed to do so.  WOUND CARE INSTRUCTIONS (if applicable):  Keep a dry clean dressing on the anesthesia/puncture wound site if there is drainage.  Once the wound has quit draining you may leave it open to air.  Generally you should leave the bandage intact for twenty four  hours unless there is drainage.  If the epidural site drains for more than 36-48 hours please call the anesthesia department.  QUESTIONS?:  Please feel free to call your physician or the hospital operator if you have any questions, and they will be happy to assist you.      Cataract Surgery Cataract surgery is a procedure to remove a cataract from your eye. A cataract is cloudiness on the lens of your eye. The lens focuses light inside the eye. When a lens becomes cloudy, your vision is affected. Cataract surgery is a procedure to remove the cloudy lens. A substitute lens (intraocular lens or IOL) is usually inserted as a replacement for the cloudy lens. Tell a health care provider about:  Any allergies you have.  All medicines you are taking, including vitamins, herbs, eye drops, creams, and over-the-counter medicines.  Any problems you or family members have had with anesthetic medicines.  Any blood disorders you have.  Any surgeries you have had, especially eye surgeries that include refractive surgery, such as PRK and LASIK.  Any medical conditions you have.  Whether you are pregnant or may be pregnant. What are the risks? Generally, this is a safe procedure. However, problems may occur, including:  Infection.  Bleeding.  Glaucoma.  Retinal detachment.  Allergic reactions to medicines.  Damage to other structures or organs.  Inflammation of the eye.  Clouding of the part of your eye that holds an IOL in place (  after-cataract), if an IOL was inserted. This is fairly common.  An IOL moving out of position, if an IOL was inserted. This is very rare.  Loss of vision. This is rare.  What happens before the procedure?  Follow instructions from your health care provider about eating or drinking restrictions.  Ask your health care provider about: ? Changing or stopping your regular medicines, including any eye drops you have been prescribed. This is especially important  if you are taking diabetes medicines or blood thinners. ? Taking medicines such as aspirin and ibuprofen. These medicines can thin your blood. Do not take these medicines before your procedure if your health care provider instructs you not to.  Do not put contact lenses in either eye on the day of your surgery.  Plan for someone to drive you to and from the procedure.  If you will be going home right after the procedure, plan to have someone with you for 24 hours. What happens during the procedure?  An IV tube may be inserted into one of your veins.  You will be given one or more of the following: ? A medicine to help you relax (sedative). ? A medicine to numb the area (local anesthetic). This may be numbing eye drops or an injection that is given behind the eye.  A small cut (incision) will be made to the edge of the clear, dome-shaped surface that covers the front of the eye (cornea).  A small probe will be inserted into the eye. This device gives off ultrasound waves that soften and break up the cloudy center of the lens. This makes it easier for the cloudy lens to be removed by suction.  An IOL may be implanted.  Part of the capsule that surrounds the lens will be left in the eye to support the IOL.  Your surgeon may use stitches (sutures) to close the incision. The procedure may vary among health care providers and hospitals. What happens after the procedure?  Your blood pressure, heart rate, breathing rate, and blood oxygen level will be monitored often until the medicines you were given have worn off.  You may be given a protective shield to wear over your eyes.  Do not drive for 24 hours if you received a sedative. This information is not intended to replace advice given to you by your health care provider. Make sure you discuss any questions you have with your health care provider. Document Released: 09/15/2011 Document Revised: 03/03/2016 Document Reviewed:  08/06/2015 Elsevier Interactive Patient Education  [employer].

## 2018-05-30 ENCOUNTER — Other Ambulatory Visit: Payer: Self-pay

## 2018-05-30 ENCOUNTER — Encounter (HOSPITAL_COMMUNITY): Payer: Self-pay

## 2018-05-30 ENCOUNTER — Encounter (HOSPITAL_COMMUNITY)
Admission: RE | Admit: 2018-05-30 | Discharge: 2018-05-30 | Disposition: A | Discharge status: Home / Self Care | Payer: Medicare Other | Source: Ambulatory Visit | Attending: Ophthalmology | Admitting: Ophthalmology

## 2018-05-30 DIAGNOSIS — Z01810 Encounter for preprocedural cardiovascular examination: Secondary | ICD-10-CM | POA: Insufficient documentation

## 2018-05-30 DIAGNOSIS — Z01812 Encounter for preprocedural laboratory examination: Secondary | ICD-10-CM | POA: Insufficient documentation

## 2018-05-30 LAB — HEMOGLOBIN A1C
HEMOGLOBIN A1C: 6.7 % — AB (ref 4.8–5.6)
MEAN PLASMA GLUCOSE: 145.59 mg/dL

## 2018-05-30 LAB — BASIC METABOLIC PANEL
Anion gap: 10 (ref 5–15)
BUN: 27 mg/dL — AB (ref 8–23)
CALCIUM: 9.5 mg/dL (ref 8.9–10.3)
CO2: 26 mmol/L (ref 22–32)
CREATININE: 1.45 mg/dL — AB (ref 0.44–1.00)
Chloride: 101 mmol/L (ref 98–111)
GFR calc Af Amer: 42 mL/min — ABNORMAL LOW (ref >60)
GFR, EST NON AFRICAN AMERICAN: 36 mL/min — AB (ref >60)
Glucose, Bld: 131 mg/dL — ABNORMAL HIGH (ref 70–99)
POTASSIUM: 4 mmol/L (ref 3.5–5.1)
SODIUM: 137 mmol/L (ref 135–145)

## 2018-05-30 LAB — CBC
HEMATOCRIT: 40.8 % (ref 36.0–46.0)
Hemoglobin: 14 g/dL (ref 12.0–15.0)
MCH: 32 pg (ref 26.0–34.0)
MCHC: 34.3 g/dL (ref 30.0–36.0)
MCV: 93.2 fL (ref 78.0–100.0)
PLATELETS: 342 10*3/uL (ref 150–400)
RBC: 4.38 MIL/uL (ref 3.87–5.11)
RDW: 12.5 % (ref 11.5–15.5)
WBC: 8.2 10*3/uL (ref 4.0–10.5)

## 2018-05-30 LAB — GLUCOSE, CAPILLARY: GLUCOSE-CAPILLARY: 126 mg/dL — AB (ref 70–99)

## 2018-05-31 NOTE — Pre-Procedure Instructions (Signed)
HgbA1C routed to PCP. 

## 2018-06-04 ENCOUNTER — Encounter (HOSPITAL_COMMUNITY): Payer: Self-pay | Admitting: *Deleted

## 2018-06-04 ENCOUNTER — Encounter (HOSPITAL_COMMUNITY)
Admission: RE | Disposition: A | Discharge status: Home / Self Care | Payer: Self-pay | Source: Ambulatory Visit | Attending: Ophthalmology

## 2018-06-04 ENCOUNTER — Ambulatory Visit (HOSPITAL_COMMUNITY): Payer: Medicare Other | Admitting: Anesthesiology

## 2018-06-04 ENCOUNTER — Ambulatory Visit (HOSPITAL_COMMUNITY)
Admission: RE | Admit: 2018-06-04 | Discharge: 2018-06-04 | Disposition: A | Discharge status: Home / Self Care | Payer: Medicare Other | Source: Ambulatory Visit | Attending: Ophthalmology | Admitting: Ophthalmology

## 2018-06-04 DIAGNOSIS — I10 Essential (primary) hypertension: Secondary | ICD-10-CM | POA: Insufficient documentation

## 2018-06-04 DIAGNOSIS — H25811 Combined forms of age-related cataract, right eye: Secondary | ICD-10-CM | POA: Diagnosis not present

## 2018-06-04 DIAGNOSIS — H2511 Age-related nuclear cataract, right eye: Secondary | ICD-10-CM | POA: Diagnosis not present

## 2018-06-04 DIAGNOSIS — K219 Gastro-esophageal reflux disease without esophagitis: Secondary | ICD-10-CM | POA: Insufficient documentation

## 2018-06-04 HISTORY — PX: CATARACT EXTRACTION W/PHACO: SHX586

## 2018-06-04 LAB — GLUCOSE, CAPILLARY: GLUCOSE-CAPILLARY: 144 mg/dL — AB (ref 70–99)

## 2018-06-04 SURGERY — PHACOEMULSIFICATION, CATARACT, WITH IOL INSERTION
Anesthesia: Monitor Anesthesia Care | Site: Eye | Laterality: Right

## 2018-06-04 MED ORDER — MIDAZOLAM HCL 2 MG/2ML IJ SOLN
INTRAMUSCULAR | Status: DC | PRN
  Administered 2018-06-04: 2 mg via INTRAVENOUS

## 2018-06-04 MED ORDER — TETRACAINE HCL 0.5 % OP SOLN
1.0000 [drp] | OPHTHALMIC | Status: AC
  Administered 2018-06-04 (×3): 1 [drp] via OPHTHALMIC

## 2018-06-04 MED ORDER — EPINEPHRINE PF 1 MG/ML IJ SOLN
INTRAOCULAR | Status: DC | PRN
  Administered 2018-06-04: 500 mL

## 2018-06-04 MED ORDER — NEOMYCIN-POLYMYXIN-DEXAMETH 3.5-10000-0.1 OP SUSP
OPHTHALMIC | Status: DC | PRN
  Administered 2018-06-04: 2 [drp] via OPHTHALMIC

## 2018-06-04 MED ORDER — LACTATED RINGERS IV SOLN
INTRAVENOUS | Status: DC
  Administered 2018-06-04: 1000 mL via INTRAVENOUS

## 2018-06-04 MED ORDER — LIDOCAINE HCL (PF) 1 % IJ SOLN
INTRAMUSCULAR | Status: DC | PRN
  Administered 2018-06-04: .5 mL

## 2018-06-04 MED ORDER — LIDOCAINE HCL 3.5 % OP GEL
1.0000 "application " | Freq: Once | OPHTHALMIC | Status: AC
  Administered 2018-06-04: 1 via OPHTHALMIC

## 2018-06-04 MED ORDER — PROVISC 10 MG/ML IO SOLN
INTRAOCULAR | Status: DC | PRN
  Administered 2018-06-04: 0.85 mL via INTRAOCULAR

## 2018-06-04 MED ORDER — MIDAZOLAM HCL 2 MG/2ML IJ SOLN
INTRAMUSCULAR | Status: AC
  Filled 2018-06-04: qty 2

## 2018-06-04 MED ORDER — POVIDONE-IODINE 5 % OP SOLN
OPHTHALMIC | Status: DC | PRN
  Administered 2018-06-04: 1 via OPHTHALMIC

## 2018-06-04 MED ORDER — CYCLOPENTOLATE-PHENYLEPHRINE 0.2-1 % OP SOLN
1.0000 [drp] | OPHTHALMIC | Status: AC
  Administered 2018-06-04 (×3): 1 [drp] via OPHTHALMIC

## 2018-06-04 MED ORDER — BSS IO SOLN
INTRAOCULAR | Status: DC | PRN
  Administered 2018-06-04: 15 mL

## 2018-06-04 MED ORDER — PHENYLEPHRINE HCL 2.5 % OP SOLN
1.0000 [drp] | OPHTHALMIC | Status: AC
  Administered 2018-06-04 (×3): 1 [drp] via OPHTHALMIC

## 2018-06-04 MED ORDER — EPINEPHRINE PF 1 MG/ML IJ SOLN
INTRAMUSCULAR | Status: AC
  Filled 2018-06-04: qty 1

## 2018-06-04 SURGICAL SUPPLY — 12 items
CLOTH BEACON ORANGE TIMEOUT ST (SAFETY) ×2 IMPLANT
EYE SHIELD UNIVERSAL CLEAR (GAUZE/BANDAGES/DRESSINGS) ×2 IMPLANT
GLOVE BIOGEL PI IND STRL 6.5 (GLOVE) IMPLANT
GLOVE BIOGEL PI IND STRL 7.0 (GLOVE) IMPLANT
GLOVE BIOGEL PI INDICATOR 6.5 (GLOVE) ×2
GLOVE BIOGEL PI INDICATOR 7.0 (GLOVE) ×2
LENS ALC ACRYL/TECN (Ophthalmic Related) ×2 IMPLANT
PAD ARMBOARD 7.5X6 YLW CONV (MISCELLANEOUS) ×2 IMPLANT
SYRINGE LUER LOK 1CC (MISCELLANEOUS) ×2 IMPLANT
TAPE SURG TRANSPORE 1 IN (GAUZE/BANDAGES/DRESSINGS) IMPLANT
TAPE SURGICAL TRANSPORE 1 IN (GAUZE/BANDAGES/DRESSINGS) ×2
WATER STERILE IRR 250ML POUR (IV SOLUTION) ×2 IMPLANT

## 2018-06-04 NOTE — Anesthesia Postprocedure Evaluation (Signed)
Anesthesia Post Note  Patient: Jennifer Morrison  Procedure(s) Performed: CATARACT EXTRACTION PHACO AND INTRAOCULAR LENS PLACEMENT RIGHT EYE CDE=7.64 (Right Eye)  Patient location during evaluation: Short Stay Anesthesia Type: MAC Level of consciousness: awake and alert and patient cooperative Pain management: pain level controlled Vital Signs Assessment: post-procedure vital signs reviewed and stable Respiratory status: spontaneous breathing Cardiovascular status: stable Postop Assessment: no apparent nausea or vomiting Anesthetic complications: no     Last Vitals:  Vitals:   06/04/18 0945 06/04/18 1000  BP:    Resp: (!) 21 17  Temp:    SpO2: 100% 99%    Last Pain:  Vitals:   06/04/18 0940  TempSrc: Oral  PainSc: 0-No pain                 Koby Hartfield

## 2018-06-04 NOTE — Discharge Instructions (Signed)

## 2018-06-04 NOTE — Anesthesia Procedure Notes (Signed)
Procedure Name: MAC Date/Time: 06/04/2018 9:14 AM Performed by: Vista Deck, CRNA Pre-anesthesia Checklist: Patient identified, Emergency Drugs available, Suction available, Timeout performed and Patient being monitored Patient Re-evaluated:Patient Re-evaluated prior to induction Oxygen Delivery Method: Nasal Cannula

## 2018-06-04 NOTE — H&P (Signed)
I have reviewed the H&P, the patient was re-examined, and I have identified no interval changes in medical condition and plan of care since the history and physical of record  

## 2018-06-04 NOTE — Anesthesia Preprocedure Evaluation (Signed)
Anesthesia Evaluation  Patient identified by MRN, date of birth, ID band Patient awake    Reviewed: Allergy & Precautions, NPO status , Patient's Chart, lab work & pertinent test results  Airway Mallampati: II  TM Distance: >3 FB Neck ROM: Full    Dental no notable dental hx. (+) Teeth Intact   Pulmonary neg pulmonary ROS,    Pulmonary exam normal breath sounds clear to auscultation       Cardiovascular Exercise Tolerance: Good hypertension, Pt. on medications negative cardio ROS Normal cardiovascular examI Rhythm:Regular Rate:Normal     Neuro/Psych negative neurological ROS  negative psych ROS   GI/Hepatic negative GI ROS, Neg liver ROS, GERD  Medicated and Controlled,  Endo/Other  negative endocrine ROSdiabetes, Well Controlled, Type 2  Renal/GU negative Renal ROS  negative genitourinary   Musculoskeletal negative musculoskeletal ROS (+)   Abdominal   Peds negative pediatric ROS (+)  Hematology negative hematology ROS (+)   Anesthesia Other Findings   Reproductive/Obstetrics negative OB ROS S/p TAH BSO for endometrial Ca 2017                             Anesthesia Physical Anesthesia Plan  ASA: II  Anesthesia Plan: MAC   Post-op Pain Management:    Induction: Intravenous  PONV Risk Score and Plan:   Airway Management Planned: Nasal Cannula  Additional Equipment:   Intra-op Plan:   Post-operative Plan:   Informed Consent: I have reviewed the patients History and Physical, chart, labs and discussed the procedure including the risks, benefits and alternatives for the proposed anesthesia with the patient or authorized representative who has indicated his/her understanding and acceptance.   Dental advisory given  Plan Discussed with: CRNA  Anesthesia Plan Comments:         Anesthesia Quick Evaluation

## 2018-06-04 NOTE — Op Note (Signed)
Date of Admission: 06/04/2018  Date of Surgery: 06/04/2018  Pre-Op Dx: Cataract Right  Eye  Post-Op Dx: Senile Combined Cataract  Right  Eye,  Dx Code C94.496  Surgeon: Tonny Branch, M.D.  Assistants: None  Anesthesia: Topical with MAC  Indications: Painless, progressive loss of vision with compromise of daily activities.  Surgery: Cataract Extraction with Intraocular lens Implant Right Eye  Discription: The patient had dilating drops and viscous lidocaine placed into the Right eye in the pre-op holding area. After transfer to the operating room, a time out was performed. The patient was then prepped and draped. Beginning with a 71m blade a paracentesis port was made at the surgeon's 2 o'clock position. The anterior chamber was then filled with 1% non-preserved lidocaine. This was followed by filling the anterior chamber with Provisc.  A 2.432mkeratome blade was used to make a clear corneal incision at the temporal limbus.  A bent cystatome needle was used to create a continuous tear capsulotomy. Hydrodissection was performed with balanced salt solution on a Fine canula. The lens nucleus was then removed using the phacoemulsification handpiece. Residual cortex was removed with the I&A handpiece. The anterior chamber and capsular bag were refilled with Provisc. A posterior chamber intraocular lens was placed into the capsular bag with it's injector. The implant was positioned with the Kuglan hook. The Provisc was then removed from the anterior chamber and capsular bag with the I&A handpiece. Stromal hydration of the main incision and paracentesis port was performed with BSS on a Fine canula. The wounds were tested for leak which was negative. The patient tolerated the procedure well. There were no operative complications. The patient was then transferred to the recovery room in stable condition.  Complications: None  Specimen: None  EBL: None  Prosthetic device: J&J Technis, PCB00, power 22.5,  SN [id number]

## 2018-06-04 NOTE — Transfer of Care (Signed)
Immediate Anesthesia Transfer of Care Note  Patient: Jennifer Morrison  Procedure(s) Performed: CATARACT EXTRACTION PHACO AND INTRAOCULAR LENS PLACEMENT RIGHT EYE CDE=7.64 (Right Eye)  Patient Location: Short Stay  Anesthesia Type:MAC  Level of Consciousness: awake, alert  and patient cooperative  Airway & Oxygen Therapy: Patient Spontanous Breathing  Post-op Assessment: Report given to RN and Post -op Vital signs reviewed and stable  Post vital signs: Reviewed and stable  Last Vitals:  Vitals Value Taken Time  BP    Temp    Pulse    Resp    SpO2      Last Pain:  Vitals:   06/04/18 0940  TempSrc: Oral  PainSc: 0-No pain      Patients Stated Pain Goal: 8 (09/60/45 4098)  Complications: No apparent anesthesia complications

## 2018-06-05 ENCOUNTER — Encounter (HOSPITAL_COMMUNITY): Payer: Self-pay | Admitting: Ophthalmology

## 2018-06-18 DIAGNOSIS — H25812 Combined forms of age-related cataract, left eye: Secondary | ICD-10-CM | POA: Diagnosis not present

## 2018-06-18 DIAGNOSIS — Z1231 Encounter for screening mammogram for malignant neoplasm of breast: Secondary | ICD-10-CM | POA: Diagnosis not present

## 2018-07-03 ENCOUNTER — Encounter (HOSPITAL_COMMUNITY)
Admission: RE | Admit: 2018-07-03 | Discharge: 2018-07-03 | Disposition: A | Discharge status: Home / Self Care | Payer: Medicare Other | Source: Ambulatory Visit | Attending: Ophthalmology | Admitting: Ophthalmology

## 2018-07-03 ENCOUNTER — Encounter (HOSPITAL_COMMUNITY): Payer: Self-pay

## 2018-07-09 ENCOUNTER — Encounter (HOSPITAL_COMMUNITY): Payer: Self-pay | Admitting: *Deleted

## 2018-07-09 ENCOUNTER — Other Ambulatory Visit: Payer: Self-pay

## 2018-07-09 ENCOUNTER — Ambulatory Visit (HOSPITAL_COMMUNITY)
Admission: RE | Admit: 2018-07-09 | Discharge: 2018-07-09 | Disposition: A | Discharge status: Home / Self Care | Payer: Medicare Other | Source: Ambulatory Visit | Attending: Ophthalmology | Admitting: Ophthalmology

## 2018-07-09 ENCOUNTER — Ambulatory Visit (HOSPITAL_COMMUNITY): Payer: Medicare Other | Admitting: Anesthesiology

## 2018-07-09 ENCOUNTER — Encounter (HOSPITAL_COMMUNITY)
Admission: RE | Disposition: A | Discharge status: Home / Self Care | Payer: Self-pay | Source: Ambulatory Visit | Attending: Ophthalmology

## 2018-07-09 DIAGNOSIS — H25812 Combined forms of age-related cataract, left eye: Secondary | ICD-10-CM | POA: Insufficient documentation

## 2018-07-09 DIAGNOSIS — K219 Gastro-esophageal reflux disease without esophagitis: Secondary | ICD-10-CM | POA: Diagnosis not present

## 2018-07-09 DIAGNOSIS — I10 Essential (primary) hypertension: Secondary | ICD-10-CM | POA: Diagnosis not present

## 2018-07-09 DIAGNOSIS — H269 Unspecified cataract: Secondary | ICD-10-CM | POA: Diagnosis present

## 2018-07-09 DIAGNOSIS — H2512 Age-related nuclear cataract, left eye: Secondary | ICD-10-CM | POA: Diagnosis not present

## 2018-07-09 HISTORY — PX: CATARACT EXTRACTION W/PHACO: SHX586

## 2018-07-09 LAB — GLUCOSE, CAPILLARY: GLUCOSE-CAPILLARY: 127 mg/dL — AB (ref 70–99)

## 2018-07-09 SURGERY — PHACOEMULSIFICATION, CATARACT, WITH IOL INSERTION
Anesthesia: Monitor Anesthesia Care | Site: Eye | Laterality: Left

## 2018-07-09 MED ORDER — LIDOCAINE HCL (PF) 1 % IJ SOLN
INTRAMUSCULAR | Status: DC | PRN
  Administered 2018-07-09: .4 mL

## 2018-07-09 MED ORDER — MIDAZOLAM HCL 2 MG/2ML IJ SOLN
INTRAMUSCULAR | Status: AC
  Filled 2018-07-09: qty 2

## 2018-07-09 MED ORDER — CYCLOPENTOLATE-PHENYLEPHRINE 0.2-1 % OP SOLN
1.0000 [drp] | OPHTHALMIC | Status: AC
  Administered 2018-07-09 (×3): 1 [drp] via OPHTHALMIC

## 2018-07-09 MED ORDER — PROMETHAZINE HCL 25 MG/ML IJ SOLN: 6.2500 mg | INTRAMUSCULAR | Status: DC | PRN

## 2018-07-09 MED ORDER — EPINEPHRINE PF 1 MG/ML IJ SOLN
INTRAOCULAR | Status: DC | PRN
  Administered 2018-07-09: 500 mL

## 2018-07-09 MED ORDER — TETRACAINE HCL 0.5 % OP SOLN
1.0000 [drp] | OPHTHALMIC | Status: AC
  Administered 2018-07-09 (×3): 1 [drp] via OPHTHALMIC

## 2018-07-09 MED ORDER — HYDROMORPHONE HCL 1 MG/ML IJ SOLN: 0.2500 mg | INTRAMUSCULAR | Status: DC | PRN

## 2018-07-09 MED ORDER — HYDROCODONE-ACETAMINOPHEN 7.5-325 MG PO TABS: 1.0000 | ORAL_TABLET | Freq: Once | ORAL | Status: DC | PRN

## 2018-07-09 MED ORDER — PROVISC 10 MG/ML IO SOLN
INTRAOCULAR | Status: DC | PRN
  Administered 2018-07-09: 0.85 mL via INTRAOCULAR

## 2018-07-09 MED ORDER — LIDOCAINE HCL 3.5 % OP GEL
1.0000 "application " | Freq: Once | OPHTHALMIC | Status: AC
  Administered 2018-07-09: 1 via OPHTHALMIC

## 2018-07-09 MED ORDER — LACTATED RINGERS IV SOLN: INTRAVENOUS | Status: DC

## 2018-07-09 MED ORDER — MIDAZOLAM HCL 2 MG/2ML IJ SOLN
INTRAMUSCULAR | Status: DC | PRN
  Administered 2018-07-09: 2 mg via INTRAVENOUS

## 2018-07-09 MED ORDER — POVIDONE-IODINE 5 % OP SOLN
OPHTHALMIC | Status: DC | PRN
  Administered 2018-07-09: 1 via OPHTHALMIC

## 2018-07-09 MED ORDER — LACTATED RINGERS IV SOLN
INTRAVENOUS | Status: DC
  Administered 2018-07-09: 09:00:00 via INTRAVENOUS

## 2018-07-09 MED ORDER — NEOMYCIN-POLYMYXIN-DEXAMETH 3.5-10000-0.1 OP SUSP
OPHTHALMIC | Status: DC | PRN
  Administered 2018-07-09: 2 [drp] via OPHTHALMIC

## 2018-07-09 MED ORDER — BSS IO SOLN
INTRAOCULAR | Status: DC | PRN
  Administered 2018-07-09: 15 mL

## 2018-07-09 MED ORDER — MEPERIDINE HCL 50 MG/ML IJ SOLN: 6.2500 mg | INTRAMUSCULAR | Status: DC | PRN

## 2018-07-09 MED ORDER — PHENYLEPHRINE HCL 2.5 % OP SOLN
1.0000 [drp] | OPHTHALMIC | Status: AC
  Administered 2018-07-09 (×3): 1 [drp] via OPHTHALMIC

## 2018-07-09 SURGICAL SUPPLY — 12 items
CLOTH BEACON ORANGE TIMEOUT ST (SAFETY) ×2 IMPLANT
EYE SHIELD UNIVERSAL CLEAR (GAUZE/BANDAGES/DRESSINGS) ×2 IMPLANT
GLOVE BIOGEL PI IND STRL 6.5 (GLOVE) IMPLANT
GLOVE BIOGEL PI IND STRL 7.0 (GLOVE) IMPLANT
GLOVE BIOGEL PI INDICATOR 6.5 (GLOVE) ×2
GLOVE BIOGEL PI INDICATOR 7.0 (GLOVE) ×2
LENS ALC ACRYL/TECN (Ophthalmic Related) ×2 IMPLANT
PAD ARMBOARD 7.5X6 YLW CONV (MISCELLANEOUS) ×2 IMPLANT
SYRINGE LUER LOK 1CC (MISCELLANEOUS) ×2 IMPLANT
TAPE SURG TRANSPORE 1 IN (GAUZE/BANDAGES/DRESSINGS) IMPLANT
TAPE SURGICAL TRANSPORE 1 IN (GAUZE/BANDAGES/DRESSINGS) ×2
WATER STERILE IRR 250ML POUR (IV SOLUTION) ×2 IMPLANT

## 2018-07-09 NOTE — Op Note (Signed)
Date of Admission: 07/09/2018  Date of Surgery: 07/09/2018  Pre-Op Dx: Cataract Left  Eye  Post-Op Dx: Senile Combined Cataract  Left  Eye,  Dx Code O03.212  Surgeon: Tonny Branch, M.D.  Assistants: None  Anesthesia: Topical with MAC  Indications: Painless, progressive loss of vision with compromise of daily activities.  Surgery: Cataract Extraction with Intraocular lens Implant Left Eye  Discription: The patient had dilating drops and viscous lidocaine placed into the Left eye in the pre-op holding area. After transfer to the operating room, a time out was performed. The patient was then prepped and draped. Beginning with a 22m blade a paracentesis port was made at the surgeon's 2 o'clock position. The anterior chamber was then filled with 1% non-preserved lidocaine. This was followed by filling the anterior chamber with Provisc.  A 2.442mkeratome blade was used to make a clear corneal incision at the temporal limbus.  A bent cystatome needle was used to create a continuous tear capsulotomy. Hydrodissection was performed with balanced salt solution on a Fine canula. The lens nucleus was then removed using the phacoemulsification handpiece. Residual cortex was removed with the I&A handpiece. The anterior chamber and capsular bag were refilled with Provisc. A posterior chamber intraocular lens was placed into the capsular bag with it's injector. The implant was positioned with the Kuglan hook. The Provisc was then removed from the anterior chamber and capsular bag with the I&A handpiece. Stromal hydration of the main incision and paracentesis port was performed with BSS on a Fine canula. The wounds were tested for leak which was negative. The patient tolerated the procedure well. There were no operative complications. The patient was then transferred to the recovery room in stable condition.  Complications: None  Specimen: None  EBL: None  Prosthetic device: J&J Technis, PCB00, power 22.5, SN  [id number]

## 2018-07-09 NOTE — Anesthesia Postprocedure Evaluation (Signed)
Anesthesia Post Note  Patient: Jennifer Morrison  Procedure(s) Performed: CATARACT EXTRACTION PHACO AND INTRAOCULAR LENS PLACEMENT (IOC) (Left Eye)  Patient location during evaluation: Short Stay Anesthesia Type: MAC Level of consciousness: awake and alert Pain management: pain level controlled Vital Signs Assessment: post-procedure vital signs reviewed and stable Respiratory status: spontaneous breathing Cardiovascular status: stable Postop Assessment: no apparent nausea or vomiting Anesthetic complications: no     Last Vitals:  Vitals:   07/09/18 0805 07/09/18 0849  BP: (!) 162/79 (!) 149/57  Pulse: 82 76  Resp: 17 18  Temp: 36.8 C 36.7 C  SpO2: 98% 100%    Last Pain:  Vitals:   07/09/18 0849  TempSrc: Oral  PainSc: 0-No pain                 Dillyn Menna

## 2018-07-09 NOTE — H&P (Signed)
I have reviewed the H&P, the patient was re-examined, and I have identified no interval changes in medical condition and plan of care since the history and physical of record  

## 2018-07-09 NOTE — Transfer of Care (Signed)
Immediate Anesthesia Transfer of Care Note  Patient: Jennifer Morrison  Procedure(s) Performed: CATARACT EXTRACTION PHACO AND INTRAOCULAR LENS PLACEMENT (IOC) (Left Eye)  Patient Location: Short Stay  Anesthesia Type:MAC  Level of Consciousness: awake, alert  and patient cooperative  Airway & Oxygen Therapy: Patient Spontanous Breathing  Post-op Assessment: Report given to RN and Post -op Vital signs reviewed and stable  Post vital signs: Reviewed and stable  Last Vitals:  Vitals Value Taken Time  BP    Temp    Pulse    Resp    SpO2      Last Pain:  Vitals:   07/09/18 0805  TempSrc: Oral  PainSc: 0-No pain      Patients Stated Pain Goal: 5 (54/00/86 7619)  Complications: No apparent anesthesia complications

## 2018-07-09 NOTE — Discharge Instructions (Signed)
Monitored Anesthesia Care, Care After  These instructions provide you with information about caring for yourself after your procedure. Your health care provider may also give you more specific instructions. Your treatment has been planned according to current medical practices, but problems sometimes occur. Call your health care provider if you have any problems or questions after your procedure.  What can I expect after the procedure?  After your procedure, it is common to:   Feel sleepy for several hours.   Feel clumsy and have poor balance for several hours.   Feel forgetful about what happened after the procedure.   Have poor judgment for several hours.   Feel nauseous or vomit.   Have a sore throat if you had a breathing tube during the procedure.    Follow these instructions at home:  For at least 24 hours after the procedure:     Do not:  ? Participate in activities in which you could fall or become injured.  ? Drive.  ? Use heavy machinery.  ? Drink alcohol.  ? Take sleeping pills or medicines that cause drowsiness.  ? Make important decisions or sign legal documents.  ? Take care of children on your own.   Rest.  Eating and drinking   Follow the diet that is recommended by your health care provider.   If you vomit, drink water, juice, or soup when you can drink without vomiting.   Make sure you have little or no nausea before eating solid foods.  General instructions   Have a responsible adult stay with you until you are awake and alert.   Take over-the-counter and prescription medicines only as told by your health care provider.   If you smoke, do not smoke without supervision.   Keep all follow-up visits as told by your health care provider. This is important.  Contact a health care provider if:   You keep feeling nauseous or you keep vomiting.   You feel light-headed.   You develop a rash.   You have a fever.  Get help right away if:   You have trouble breathing.  This information is  not intended to replace advice given to you by your health care provider. Make sure you discuss any questions you have with your health care provider.  Document Released: 01/17/2016 Document Revised: 05/18/2016 Document Reviewed: 01/17/2016  Elsevier Interactive Patient Education  2018 [employer]

## 2018-07-09 NOTE — Anesthesia Preprocedure Evaluation (Signed)
Anesthesia Evaluation  Patient identified by MRN, date of birth, ID band Patient awake    Reviewed: Allergy & Precautions, H&P , NPO status , Patient's Chart, lab work & pertinent test results, reviewed documented beta blocker date and time   Airway Mallampati: II  TM Distance: >3 FB Neck ROM: full    Dental no notable dental hx. (+) Teeth Intact, Dental Advidsory Given   Pulmonary neg pulmonary ROS,    Pulmonary exam normal breath sounds clear to auscultation       Cardiovascular Exercise Tolerance: Good hypertension, On Medications negative cardio ROS   Rhythm:regular Rate:Normal     Neuro/Psych negative neurological ROS  negative psych ROS   GI/Hepatic negative GI ROS, Neg liver ROS, GERD  ,  Endo/Other  negative endocrine ROSdiabetes, Type 2  Renal/GU negative Renal ROS  negative genitourinary   Musculoskeletal   Abdominal   Peds  Hematology negative hematology ROS (+)   Anesthesia Other Findings Returns for Phaco/IOL after contralateral eye about one month ago.  No sig interval changes.  No anesth issues  Reproductive/Obstetrics negative OB ROS                             Anesthesia Physical Anesthesia Plan  ASA: III  Anesthesia Plan: MAC   Post-op Pain Management:    Induction:   PONV Risk Score and Plan:   Airway Management Planned:   Additional Equipment:   Intra-op Plan:   Post-operative Plan:   Informed Consent: I have reviewed the patients History and Physical, chart, labs and discussed the procedure including the risks, benefits and alternatives for the proposed anesthesia with the patient or authorized representative who has indicated his/her understanding and acceptance.   Dental Advisory Given  Plan Discussed with: CRNA and Anesthesiologist  Anesthesia Plan Comments:         Anesthesia Quick Evaluation

## 2018-07-10 ENCOUNTER — Encounter (HOSPITAL_COMMUNITY): Payer: Self-pay | Admitting: Ophthalmology

## 2018-07-16 DIAGNOSIS — Z23 Encounter for immunization: Secondary | ICD-10-CM | POA: Diagnosis not present

## 2018-08-23 DIAGNOSIS — L570 Actinic keratosis: Secondary | ICD-10-CM

## 2018-08-23 DIAGNOSIS — I788 Other diseases of capillaries: Secondary | ICD-10-CM | POA: Diagnosis not present

## 2018-08-23 DIAGNOSIS — L814 Other melanin hyperpigmentation: Secondary | ICD-10-CM | POA: Diagnosis not present

## 2018-08-23 DIAGNOSIS — I8393 Asymptomatic varicose veins of bilateral lower extremities: Secondary | ICD-10-CM | POA: Diagnosis not present

## 2018-08-23 DIAGNOSIS — D485 Neoplasm of uncertain behavior of skin: Secondary | ICD-10-CM | POA: Diagnosis not present

## 2018-08-23 DIAGNOSIS — L821 Other seborrheic keratosis: Secondary | ICD-10-CM | POA: Diagnosis not present

## 2018-08-23 HISTORY — DX: Actinic keratosis: L57.0

## 2018-09-03 DIAGNOSIS — E1129 Type 2 diabetes mellitus with other diabetic kidney complication: Secondary | ICD-10-CM | POA: Diagnosis not present

## 2018-09-03 DIAGNOSIS — N183 Chronic kidney disease, stage 3 (moderate): Secondary | ICD-10-CM | POA: Diagnosis not present

## 2018-09-03 DIAGNOSIS — Z79899 Other long term (current) drug therapy: Secondary | ICD-10-CM | POA: Diagnosis not present

## 2018-09-03 DIAGNOSIS — I10 Essential (primary) hypertension: Secondary | ICD-10-CM | POA: Diagnosis not present

## 2018-09-10 DIAGNOSIS — E1122 Type 2 diabetes mellitus with diabetic chronic kidney disease: Secondary | ICD-10-CM | POA: Diagnosis not present

## 2018-09-10 DIAGNOSIS — R079 Chest pain, unspecified: Secondary | ICD-10-CM | POA: Diagnosis not present

## 2018-09-10 DIAGNOSIS — Z0001 Encounter for general adult medical examination with abnormal findings: Secondary | ICD-10-CM | POA: Diagnosis not present

## 2018-09-10 DIAGNOSIS — I129 Hypertensive chronic kidney disease with stage 1 through stage 4 chronic kidney disease, or unspecified chronic kidney disease: Secondary | ICD-10-CM | POA: Diagnosis not present

## 2018-09-21 ENCOUNTER — Encounter: Payer: Self-pay | Admitting: Gynecologic Oncology

## 2018-09-21 ENCOUNTER — Inpatient Hospital Stay: Payer: Medicare Other | Attending: Gynecologic Oncology | Admitting: Gynecologic Oncology

## 2018-09-21 VITALS — BP 163/73 | HR 73 | Temp 97.7°F | Resp 20 | Ht 62.0 in | Wt 187.3 lb

## 2018-09-21 DIAGNOSIS — Z90722 Acquired absence of ovaries, bilateral: Secondary | ICD-10-CM | POA: Insufficient documentation

## 2018-09-21 DIAGNOSIS — C541 Malignant neoplasm of endometrium: Secondary | ICD-10-CM | POA: Insufficient documentation

## 2018-09-21 DIAGNOSIS — Z90710 Acquired absence of both cervix and uterus: Secondary | ICD-10-CM | POA: Insufficient documentation

## 2018-09-21 NOTE — Progress Notes (Signed)
Follow-up Note: Gyn-Onc  Consult was requested by Dr. Nori Riis for the evaluation of Jennifer Morrison 72 y.o. female  CC:  Chief Complaint  Patient presents with  . Endometrial Cancer    Follow up    Assessment/Plan:  [name]  is a 72 y.o.  year old with stage IA grade 1 endometrioid endometrial cancer s/p robotic assisted hysterectomy, BSO and staging on 09/06/16.  Pathology revealed low risk factors for recurrence, therefore no adjuvant therapy is recommended according to NCCN guidelines.  I discussed risk for recurrence and typical symptoms encouraged her to notify us of these should they develop between visits.  I recommend she continue follow-up every 6 months for 5 years in accordance with NCCN guidelines. Those visits should include symptom assessment, physical exam and pelvic examination. Pap smears are not indicated or recommended in the routine surveillance of endometrial cancer.  She will follow-up with me in 6 months as Dr Nori Riis is retiring.  HPI: Jennifer Morrison is a 72 year old G2P2 who is seen in consultation at the request of Dr Nori Riis for endometrial cancer.    The patient reports a single episode of postmenopausal bleeding in September, 2017. Dr Nori Riis performed an [place] on 07/14/16 which showed a 5.6x3.1x3.8cm uterus with a 1.4cm endometrial stripe (no adnexal masses). A hysteroscopy and D&C was performed on 08/03/16 which revealed endometrial adenocarcinoma associated with CAH (FIGO grade 1).  The patient is obese and has DM (HbA1C 6%) on metformin. The patient has HTN.  Interval Hx:  The patient underwent robotic assisted total hysterectomy, BSO and SLN biopsy on 09/06/16. Surgery was uncomplicated. Final pathology revealed a grade 1 cancer with no myometrial invasion. It measured 2.6cm. No LVSI, cervical involvement and adnexal or nodal involvement was seen. In accordance with NCCN guidelines no adjuvant therapy was recommended.  She has no symptoms concerning for  recurrence.   Current Meds:  Outpatient Encounter Medications as of 09/21/2018  Medication Sig  . ACCU-CHEK AVIVA PLUS test strip 1 each by Other route daily.   Marland Kitchen ACCU-CHEK FASTCLIX LANCETS MISC 1 each by Other route daily.   Marland Kitchen amLODipine (NORVASC) 5 MG tablet Take 5 mg by mouth daily.  Marland Kitchen aspirin EC 81 MG tablet Take 81 mg by mouth every evening.  . clobetasol cream (TEMOVATE) 5.17 % Apply 1 application topically 2 (two) times daily as needed (eczema).  . metFORMIN (GLUCOPHAGE) 500 MG tablet Take 500 mg by mouth 2 (two) times daily.   . NON FORMULARY Place 1 drop into the left eye 3 (three) times daily. Pred-Gati-Brom (Prednisolone Acetate/Gatifloxacin/Bromfenac)  . omeprazole (PRILOSEC) 20 MG capsule Take 20 mg by mouth daily.  . pravastatin (PRAVACHOL) 20 MG tablet Take 20 mg by mouth every evening.   Marland Kitchen PRESCRIPTION MEDICATION Place 1 drop into the right eye 3 (three) times daily. Pred-Brom (Prednisolone Phosphate/Bromfenac)  . valsartan-hydrochlorothiazide (DIOVAN-HCT) 160-25 MG tablet Take 1 tablet by mouth daily.  . [DISCONTINUED] losartan-hydrochlorothiazide (HYZAAR) 100-25 MG per tablet Take 1 tablet by mouth daily.   No facility-administered encounter medications on file as of 09/21/2018.     Allergy: No Known Allergies  Social Hx:   Social History   Socioeconomic History  . Marital status: Married    Spouse name: Not on file  . Number of children: Not on file  . Years of education: Not on file  . Highest education level: Not on file  Occupational History  . Not on file  Social Needs  . Financial  resource strain: Not on file  . Food insecurity:    Worry: Not on file    Inability: Not on file  . Transportation needs:    Medical: Not on file    Non-medical: Not on file  Tobacco Use  . Smoking status: Never Smoker  . Smokeless tobacco: Never Used  Substance and Sexual Activity  . Alcohol use: No  . Drug use: No  . Sexual activity: Yes  Lifestyle  . Physical  activity:    Days per week: Not on file    Minutes per session: Not on file  . Stress: Not on file  Relationships  . Social connections:    Talks on phone: Not on file    Gets together: Not on file    Attends religious service: Not on file    Active member of club or organization: Not on file    Attends meetings of clubs or organizations: Not on file    Relationship status: Not on file  . Intimate partner violence:    Fear of current or ex partner: Not on file    Emotionally abused: Not on file    Physically abused: Not on file    Forced sexual activity: Not on file  Other Topics Concern  . Not on file  Social History Narrative  . Not on file    Past Surgical Hx:  Past Surgical History:  Procedure Laterality Date  . ABDOMINAL HYSTERECTOMY    . CATARACT EXTRACTION W/PHACO Right 06/04/2018   Procedure: CATARACT EXTRACTION PHACO AND INTRAOCULAR LENS PLACEMENT RIGHT EYE CDE=7.64;  Surgeon: Tonny Branch, MD;  Location: [facility];  Service: Ophthalmology;  Laterality: Right;  right  . CATARACT EXTRACTION W/PHACO Left 07/09/2018   Procedure: CATARACT EXTRACTION PHACO AND INTRAOCULAR LENS PLACEMENT (IOC);  Surgeon: Tonny Branch, MD;  Location: [facility];  Service: Ophthalmology;  Laterality: Left;  CDE: 5.53  . COLONOSCOPY  2005  . COLONOSCOPY N/A 11/28/2013   Procedure: COLONOSCOPY;  Surgeon: Rogene Houston, MD;  Location: [facility];  Service: Endoscopy;  Laterality: N/A;  830  . Polyp removed from uterus    . ROBOTIC ASSISTED LAP VAGINAL HYSTERECTOMY N/A 09/06/2016   Procedure: XI ROBOTIC ASSISTED LAPAROSCOPIC TOTAL HYSTERECTOMY;  Surgeon: Everitt Amber, MD;  Location: [facility];  Service: Gynecology;  Laterality: N/A;  . ROBOTIC ASSISTED SALPINGO OOPHERECTOMY Bilateral 09/06/2016   Procedure: XI ROBOTIC ASSISTED SALPINGO OOPHORECTOMY;  Surgeon: Everitt Amber, MD;  Location: [facility];  Service: Gynecology;  Laterality: Bilateral;  . SENTINEL NODE BIOPSY N/A 09/06/2016   Procedure: SENTINEL NODE  BIOPSY;  Surgeon: Everitt Amber, MD;  Location: [facility];  Service: Gynecology;  Laterality: N/A;    Past Medical Hx:  Past Medical History:  Diagnosis Date  . Diabetes mellitus without complication (HCC)    NIDDM x 1 yr  . GERD (gastroesophageal reflux disease)   . Hypercholesteremia   . Hypertension   . Uterine cancer ([place]) 08/14/2016    Past Gynecological History:  G2P2 SVD x 2 No LMP recorded. Patient has had a hysterectomy.  Family Hx:  Family History  Problem Relation Age of Onset  . Diabetes Father   . Diabetes Brother   . Colon cancer Neg Hx     Review of Systems:  Constitutional  Feels well,    ENT Normal appearing ears and nares bilaterally Skin/Breast  No rash, sores, jaundice, itching, dryness Cardiovascular  No chest pain, shortness of breath, or edema  Pulmonary  No cough or wheeze.  Gastro Intestinal  No nausea, vomitting, or diarrhoea. No bright red blood per rectum, no abdominal pain, change in bowel movement, or constipation.  Genito Urinary  No frequency, urgency, dysuria, no postmenopausal bleeding Musculo Skeletal  No myalgia, arthralgia, joint swelling or pain  Neurologic  No weakness, numbness, change in gait,  Psychology  No depression, anxiety, insomnia.   Vitals:  Blood pressure (!) 163/73, pulse 73, temperature 97.7 F (36.5 C), resp. rate 20, height 5\' 2"  (1.575 m), weight 187 lb 4.8 oz (85 kg), SpO2 99 %.  Physical Exam: WD in NAD Neck  Supple NROM, without any enlargements.  Lymph Node Survey No cervical supraclavicular or inguinal adenopathy Cardiovascular  Pulse normal rate, regularity and rhythm. S1 and S2 normal.  Lungs  Clear to auscultation bilateraly, without wheezes/crackles/rhonchi. Good air movement.  Skin  No rash/lesions/breakdown  Psychiatry  Alert and oriented to person, place, and time  Abdomen  Normoactive bowel sounds, abdomen soft, non-tender and obese without evidence of hernia. Well healed  incisions. Back No CVA tenderness Genito Urinary: surgically absent uterus cervix. Vaginal cuff healing normally. No lesions. Rectal  deferred Extremities  No bilateral cyanosis, clubbing or edema.   Thereasa Solo, MD  09/21/2018, 3:01 PM

## 2018-09-21 NOTE — Patient Instructions (Signed)
Follow up in six months or sooner if needed.  Please call for any new symptoms such as vaginal bleeding, abdominal pain, or any symptoms that persist.

## 2019-01-03 DIAGNOSIS — K219 Gastro-esophageal reflux disease without esophagitis: Secondary | ICD-10-CM | POA: Diagnosis not present

## 2019-01-03 DIAGNOSIS — E1129 Type 2 diabetes mellitus with other diabetic kidney complication: Secondary | ICD-10-CM | POA: Diagnosis not present

## 2019-01-03 DIAGNOSIS — E785 Hyperlipidemia, unspecified: Secondary | ICD-10-CM | POA: Diagnosis not present

## 2019-01-03 DIAGNOSIS — I10 Essential (primary) hypertension: Secondary | ICD-10-CM | POA: Diagnosis not present

## 2019-01-03 DIAGNOSIS — N183 Chronic kidney disease, stage 3 (moderate): Secondary | ICD-10-CM | POA: Diagnosis not present

## 2019-01-10 DIAGNOSIS — N183 Chronic kidney disease, stage 3 (moderate): Secondary | ICD-10-CM | POA: Diagnosis not present

## 2019-01-10 DIAGNOSIS — E1122 Type 2 diabetes mellitus with diabetic chronic kidney disease: Secondary | ICD-10-CM | POA: Diagnosis not present

## 2019-01-10 DIAGNOSIS — I10 Essential (primary) hypertension: Secondary | ICD-10-CM | POA: Diagnosis not present

## 2019-02-20 DIAGNOSIS — L570 Actinic keratosis: Secondary | ICD-10-CM | POA: Diagnosis not present

## 2019-02-20 DIAGNOSIS — L821 Other seborrheic keratosis: Secondary | ICD-10-CM | POA: Diagnosis not present

## 2019-02-20 DIAGNOSIS — L309 Dermatitis, unspecified: Secondary | ICD-10-CM | POA: Diagnosis not present

## 2019-02-20 DIAGNOSIS — L820 Inflamed seborrheic keratosis: Secondary | ICD-10-CM | POA: Diagnosis not present

## 2019-03-01 DIAGNOSIS — Z03818 Encounter for observation for suspected exposure to other biological agents ruled out: Secondary | ICD-10-CM | POA: Diagnosis not present

## 2019-03-25 ENCOUNTER — Other Ambulatory Visit: Payer: Self-pay

## 2019-03-25 ENCOUNTER — Inpatient Hospital Stay: Payer: Medicare Other | Attending: Gynecologic Oncology | Admitting: Gynecologic Oncology

## 2019-03-25 ENCOUNTER — Inpatient Hospital Stay: Payer: Medicare Other

## 2019-03-25 ENCOUNTER — Encounter: Payer: Self-pay | Admitting: Gynecologic Oncology

## 2019-03-25 VITALS — BP 158/80 | HR 94 | Temp 97.8°F | Resp 18 | Ht 62.0 in | Wt 187.4 lb

## 2019-03-25 DIAGNOSIS — Z90710 Acquired absence of both cervix and uterus: Secondary | ICD-10-CM | POA: Diagnosis not present

## 2019-03-25 DIAGNOSIS — R1012 Left upper quadrant pain: Secondary | ICD-10-CM

## 2019-03-25 DIAGNOSIS — C541 Malignant neoplasm of endometrium: Secondary | ICD-10-CM | POA: Diagnosis present

## 2019-03-25 DIAGNOSIS — Z90722 Acquired absence of ovaries, bilateral: Secondary | ICD-10-CM | POA: Diagnosis not present

## 2019-03-25 LAB — BASIC METABOLIC PANEL
Anion gap: 14 (ref 5–15)
BUN: 21 mg/dL (ref 8–23)
CO2: 21 mmol/L — ABNORMAL LOW (ref 22–32)
Calcium: 9.9 mg/dL (ref 8.9–10.3)
Chloride: 105 mmol/L (ref 98–111)
Creatinine, Ser: 1.48 mg/dL — ABNORMAL HIGH (ref 0.44–1.00)
GFR calc Af Amer: 41 mL/min — ABNORMAL LOW (ref >60)
GFR calc non Af Amer: 36 mL/min — ABNORMAL LOW (ref >60)
Glucose, Bld: 118 mg/dL — ABNORMAL HIGH (ref 70–99)
Potassium: 4.1 mmol/L (ref 3.5–5.1)
Sodium: 140 mmol/L (ref 135–145)

## 2019-03-25 NOTE — Progress Notes (Signed)
Follow-up Note: Gyn-Onc  Consult was requested by Dr. Nori Morrison for the evaluation of Jennifer Morrison 73 y.o. female  CC:  Chief Complaint  Patient presents with  . Endometrial cancer [facility])    Assessment/Plan:  [name]  is a 73 y.o.  year old with stage IA grade 1 endometrioid endometrial cancer s/p robotic assisted hysterectomy, BSO and staging on 09/06/16.  Pathology revealed low risk factors for recurrence, therefore no adjuvant therapy is recommended according to NCCN guidelines.  I discussed risk for recurrence and typical symptoms encouraged her to notify us of these should they develop between visits.  Will work- up her abdominal pain with a CT abd/pelvis.   I recommend she continue follow-up every 6 months for 5 years in accordance with NCCN guidelines. Those visits should include symptom assessment, physical exam and pelvic examination. Pap smears are not indicated or recommended in the routine surveillance of endometrial cancer.  She will follow-up with me in 6 months as Dr Jennifer Morrison is retiring.  HPI: Jennifer Morrison is a 73 year old G2P2 who is seen in consultation at the request of Dr Jennifer Morrison for endometrial cancer.    The patient reports a single episode of postmenopausal bleeding in September, 2017. Dr Jennifer Morrison performed an [place] on 07/14/16 which showed a 5.6x3.1x3.8cm uterus with a 1.4cm endometrial stripe (no adnexal masses). A hysteroscopy and D&C was performed on 08/03/16 which revealed endometrial adenocarcinoma associated with CAH (FIGO grade 1).  The patient is obese and has DM (HbA1C 6%) on metformin. The patient has HTN.  Interval Hx:  The patient underwent robotic assisted total hysterectomy, BSO and SLN biopsy on 09/06/16. Surgery was uncomplicated. Final pathology revealed a grade 1 cancer with no myometrial invasion. It measured 2.6cm. No LVSI, cervical involvement and adnexal or nodal involvement was seen. In accordance with NCCN guidelines no adjuvant therapy was  recommended.  She has no symptoms concerning for recurrence. However, for about 1 year she has some "catching" left upper quadrant abdominal pains. Unclear the associated activities when she notices it. Her husband has been recently diagnosed with advanced lung cancer and receiving chemo radiation for this and experiencing renal failure.  This is closing but he significant stress.  Today we discussed survivorship issues including stress management, emotional wellbeing, and long-term sequelae from surgical management.   Current Meds:  Outpatient Encounter Medications as of 03/25/2019  Medication Sig  . ACCU-CHEK AVIVA PLUS test strip 1 each by Other route daily.   Marland Kitchen ACCU-CHEK FASTCLIX LANCETS MISC 1 each by Other route daily.   Marland Kitchen amLODipine (NORVASC) 5 MG tablet Take 5 mg by mouth daily.  Marland Kitchen aspirin EC 81 MG tablet Take 81 mg by mouth every evening.  . clobetasol cream (TEMOVATE) 4.09 % Apply 1 application topically 2 (two) times daily as needed (eczema).  . metFORMIN (GLUCOPHAGE) 500 MG tablet Take 500 mg by mouth 2 (two) times daily.   . NON FORMULARY Place 1 drop into the left eye 3 (three) times daily. Pred-Gati-Brom (Prednisolone Acetate/Gatifloxacin/Bromfenac)  . omeprazole (PRILOSEC) 20 MG capsule Take 20 mg by mouth daily.  . pravastatin (PRAVACHOL) 20 MG tablet Take 20 mg by mouth every evening.   Marland Kitchen PRESCRIPTION MEDICATION Place 1 drop into the right eye 3 (three) times daily. Pred-Brom (Prednisolone Phosphate/Bromfenac)  . valsartan-hydrochlorothiazide (DIOVAN-HCT) 160-25 MG tablet Take 1 tablet by mouth daily.   No facility-administered encounter medications on file as of 03/25/2019.     Allergy: No Known Allergies  Social  Hx:   Social History   Socioeconomic History  . Marital status: Married    Spouse name: Jennifer Morrison  . Number of children: Not on file  . Years of education: Not on file  . Highest education level: Not on file  Occupational History  . Not on file  Social  Needs  . Financial resource strain: Not on file  . Food insecurity    Worry: Not on file    Inability: Not on file  . Transportation needs    Medical: Not on file    Non-medical: Not on file  Tobacco Use  . Smoking status: Never Smoker  . Smokeless tobacco: Never Used  Substance and Sexual Activity  . Alcohol use: No  . Drug use: No  . Sexual activity: Yes  Lifestyle  . Physical activity    Days per week: Not on file    Minutes per session: Not on file  . Stress: Not on file  Relationships  . Social Herbalist on phone: Not on file    Gets together: Not on file    Attends religious service: Not on file    Active member of club or organization: Not on file    Attends meetings of clubs or organizations: Not on file    Relationship status: Not on file  . Intimate partner violence    Fear of current or ex partner: Not on file    Emotionally abused: Not on file    Physically abused: Not on file    Forced sexual activity: Not on file  Other Topics Concern  . Not on file  Social History Narrative  . Not on file    Past Surgical Hx:  Past Surgical History:  Procedure Laterality Date  . ABDOMINAL HYSTERECTOMY    . CATARACT EXTRACTION W/PHACO Right 06/04/2018   Procedure: CATARACT EXTRACTION PHACO AND INTRAOCULAR LENS PLACEMENT RIGHT EYE CDE=7.64;  Surgeon: Tonny Branch, MD;  Location: [facility];  Service: Ophthalmology;  Laterality: Right;  right  . CATARACT EXTRACTION W/PHACO Left 07/09/2018   Procedure: CATARACT EXTRACTION PHACO AND INTRAOCULAR LENS PLACEMENT (IOC);  Surgeon: Tonny Branch, MD;  Location: [facility];  Service: Ophthalmology;  Laterality: Left;  CDE: 5.53  . COLONOSCOPY  2005  . COLONOSCOPY N/A 11/28/2013   Procedure: COLONOSCOPY;  Surgeon: Rogene Houston, MD;  [facility]: [facility];  Service: Endoscopy;  Laterality: N/A;  830  . Polyp removed from uterus    . ROBOTIC ASSISTED LAP VAGINAL HYSTERECTOMY N/A 09/06/2016   Procedure: XI ROBOTIC ASSISTED  LAPAROSCOPIC TOTAL HYSTERECTOMY;  Surgeon: Everitt Amber, MD;  Location: [facility];  Service: Gynecology;  Laterality: N/A;  . ROBOTIC ASSISTED SALPINGO OOPHERECTOMY Bilateral 09/06/2016   Procedure: XI ROBOTIC ASSISTED SALPINGO OOPHORECTOMY;  Surgeon: Everitt Amber, MD;  Location: [facility];  Service: Gynecology;  Laterality: Bilateral;  . SENTINEL NODE BIOPSY N/A 09/06/2016   Procedure: SENTINEL NODE BIOPSY;  Surgeon: Everitt Amber, MD;  Location: [facility];  Service: Gynecology;  Laterality: N/A;    Past Medical Hx:  Past Medical History:  Diagnosis Date  . Diabetes mellitus without complication (HCC)    NIDDM x 1 yr  . GERD (gastroesophageal reflux disease)   . Hx of dysplastic nevus 02/2018   L buttock  . Hypercholesteremia   . Hypertension   . Uterine cancer ([place]) 08/14/2016    Past Gynecological History:  G2P2 SVD x 2 No LMP recorded. Patient has had a hysterectomy.  Family Hx:  Family History  Problem Relation Age of Onset  . Diabetes Father   . Diabetes Brother   . Colon cancer Neg Hx     Review of Systems:  Constitutional  Feels well,    ENT Normal appearing ears and nares bilaterally Skin/Breast  No rash, sores, jaundice, itching, dryness Cardiovascular  No chest pain, shortness of breath, or edema  Pulmonary  No cough or wheeze.  Gastro Intestinal  No nausea, vomitting, or diarrhoea. No bright red blood per rectum, change in bowel movement, or constipation.  + left upper quadrant pains  Genito Urinary  No frequency, urgency, dysuria, no postmenopausal bleeding Musculo Skeletal  No myalgia, arthralgia, joint swelling or pain  Neurologic  No weakness, numbness, change in gait,  Psychology  No depression, anxiety, insomnia.   Vitals:  Blood pressure (!) 158/80, pulse 94, temperature 97.8 F (36.6 C), temperature source Oral, resp. rate 18, height 5\' 2"  (1.575 m), weight 187 lb 6.4 oz (85 kg), SpO2 98 %.  Physical Exam: WD in NAD Neck  Supple NROM, without any  enlargements.  Lymph Node Survey No cervical supraclavicular or inguinal adenopathy Cardiovascular  Pulse normal rate, regularity and rhythm. S1 and S2 normal.  Lungs  Clear to auscultation bilateraly, without wheezes/crackles/rhonchi. Good air movement.  Skin  No rash/lesions/breakdown  Psychiatry  Alert and oriented to person, place, and time  Abdomen  Normoactive bowel sounds, abdomen soft, non-tender and obese without evidence of hernia. Well healed incisions. No palpable lesion in the left upper quadrant.  Back No CVA tenderness Genito Urinary: surgically absent uterus cervix. Vaginal cuff smooth and free of masses and blood. No lesions. Rectal  deferred Extremities  No bilateral cyanosis, clubbing or edema.   Thereasa Solo, MD  03/25/2019, 2:50 PM

## 2019-03-25 NOTE — Patient Instructions (Signed)
Please notify Dr Denman George at phone number [phone] if you notice vaginal bleeding, new pelvic or abdominal pains, bloating, feeling full easy, or a change in bladder or bowel function.   Dr Denman George is ordering a CT scan to better evaluate the pains in the left abdomen that you are having.  Please contact Dr Serita Grit office (at [phone]) in September, 2020 to request an appointment with her for December, 2020.

## 2019-04-09 ENCOUNTER — Telehealth: Payer: Self-pay

## 2019-04-09 ENCOUNTER — Other Ambulatory Visit: Payer: Self-pay

## 2019-04-09 ENCOUNTER — Ambulatory Visit (HOSPITAL_COMMUNITY)
Admission: RE | Admit: 2019-04-09 | Discharge: 2019-04-09 | Disposition: A | Discharge status: Home / Self Care | Payer: Medicare Other | Source: Ambulatory Visit | Attending: Gynecologic Oncology | Admitting: Gynecologic Oncology

## 2019-04-09 ENCOUNTER — Encounter (HOSPITAL_COMMUNITY): Payer: Self-pay

## 2019-04-09 DIAGNOSIS — C541 Malignant neoplasm of endometrium: Secondary | ICD-10-CM | POA: Diagnosis present

## 2019-04-09 DIAGNOSIS — R1012 Left upper quadrant pain: Secondary | ICD-10-CM | POA: Diagnosis not present

## 2019-04-09 DIAGNOSIS — K5730 Diverticulosis of large intestine without perforation or abscess without bleeding: Secondary | ICD-10-CM | POA: Diagnosis not present

## 2019-04-09 DIAGNOSIS — K760 Fatty (change of) liver, not elsewhere classified: Secondary | ICD-10-CM | POA: Diagnosis not present

## 2019-04-09 MED ORDER — IOHEXOL 300 MG/ML  SOLN
100.0000 mL | Freq: Once | INTRAMUSCULAR | Status: AC | PRN
  Administered 2019-04-09: 75 mL via INTRAVENOUS

## 2019-04-09 MED ORDER — SODIUM CHLORIDE (PF) 0.9 % IJ SOLN
INTRAMUSCULAR | Status: AC
  Filled 2019-04-09: qty 50

## 2019-04-09 NOTE — Telephone Encounter (Signed)
Told Jennifer Morrison that the scan showed no acute findings within the abdomen.or pelvis and no evidence of recurrent or metastatic cancer per [name], NP.   Will send a copy to her PCP as the scan shows a mild fatty liver, mild diverticulosis, and aortic atherosclerosis.

## 2019-05-13 DIAGNOSIS — N183 Chronic kidney disease, stage 3 (moderate): Secondary | ICD-10-CM | POA: Diagnosis not present

## 2019-05-13 DIAGNOSIS — E1129 Type 2 diabetes mellitus with other diabetic kidney complication: Secondary | ICD-10-CM | POA: Diagnosis not present

## 2019-05-13 DIAGNOSIS — Z79899 Other long term (current) drug therapy: Secondary | ICD-10-CM | POA: Diagnosis not present

## 2019-05-20 DIAGNOSIS — K760 Fatty (change of) liver, not elsewhere classified: Secondary | ICD-10-CM | POA: Diagnosis not present

## 2019-05-20 DIAGNOSIS — N183 Chronic kidney disease, stage 3 (moderate): Secondary | ICD-10-CM | POA: Diagnosis not present

## 2019-05-20 DIAGNOSIS — I10 Essential (primary) hypertension: Secondary | ICD-10-CM | POA: Diagnosis not present

## 2019-05-20 DIAGNOSIS — E1122 Type 2 diabetes mellitus with diabetic chronic kidney disease: Secondary | ICD-10-CM | POA: Diagnosis not present

## 2019-05-20 DIAGNOSIS — I700 Atherosclerosis of aorta: Secondary | ICD-10-CM | POA: Diagnosis not present

## 2019-06-18 ENCOUNTER — Other Ambulatory Visit (HOSPITAL_COMMUNITY): Payer: Self-pay | Admitting: Internal Medicine

## 2019-06-18 DIAGNOSIS — Z1231 Encounter for screening mammogram for malignant neoplasm of breast: Secondary | ICD-10-CM

## 2019-06-25 DIAGNOSIS — Z23 Encounter for immunization: Secondary | ICD-10-CM | POA: Diagnosis not present

## 2019-07-12 ENCOUNTER — Ambulatory Visit (HOSPITAL_COMMUNITY)
Admission: RE | Admit: 2019-07-12 | Discharge: 2019-07-12 | Disposition: A | Discharge status: Home / Self Care | Payer: Medicare Other | Source: Ambulatory Visit | Attending: Internal Medicine | Admitting: Internal Medicine

## 2019-07-12 ENCOUNTER — Other Ambulatory Visit: Payer: Self-pay

## 2019-07-12 DIAGNOSIS — Z1231 Encounter for screening mammogram for malignant neoplasm of breast: Secondary | ICD-10-CM | POA: Insufficient documentation

## 2019-09-12 DIAGNOSIS — D229 Melanocytic nevi, unspecified: Secondary | ICD-10-CM | POA: Diagnosis not present

## 2019-09-12 DIAGNOSIS — D485 Neoplasm of uncertain behavior of skin: Secondary | ICD-10-CM | POA: Diagnosis not present

## 2019-09-12 DIAGNOSIS — L814 Other melanin hyperpigmentation: Secondary | ICD-10-CM | POA: Diagnosis not present

## 2019-09-12 DIAGNOSIS — D2230 Melanocytic nevi of unspecified part of face: Secondary | ICD-10-CM | POA: Diagnosis not present

## 2019-09-12 DIAGNOSIS — D225 Melanocytic nevi of trunk: Secondary | ICD-10-CM | POA: Diagnosis not present

## 2019-09-12 DIAGNOSIS — L570 Actinic keratosis: Secondary | ICD-10-CM | POA: Diagnosis not present

## 2019-09-12 DIAGNOSIS — C4491 Basal cell carcinoma of skin, unspecified: Secondary | ICD-10-CM

## 2019-09-12 DIAGNOSIS — C44319 Basal cell carcinoma of skin of other parts of face: Secondary | ICD-10-CM | POA: Diagnosis not present

## 2019-09-12 DIAGNOSIS — Z1283 Encounter for screening for malignant neoplasm of skin: Secondary | ICD-10-CM | POA: Diagnosis not present

## 2019-09-12 HISTORY — DX: Basal cell carcinoma of skin, unspecified: C44.91

## 2019-09-19 ENCOUNTER — Encounter: Payer: Self-pay | Admitting: Gynecologic Oncology

## 2019-09-19 ENCOUNTER — Inpatient Hospital Stay: Payer: Medicare Other | Attending: Gynecologic Oncology | Admitting: Gynecologic Oncology

## 2019-09-19 ENCOUNTER — Other Ambulatory Visit: Payer: Self-pay

## 2019-09-19 VITALS — BP 155/84 | HR 77 | Temp 97.9°F | Resp 17 | Ht 62.0 in | Wt 182.0 lb

## 2019-09-19 DIAGNOSIS — I10 Essential (primary) hypertension: Secondary | ICD-10-CM | POA: Insufficient documentation

## 2019-09-19 DIAGNOSIS — E1129 Type 2 diabetes mellitus with other diabetic kidney complication: Secondary | ICD-10-CM | POA: Diagnosis not present

## 2019-09-19 DIAGNOSIS — E119 Type 2 diabetes mellitus without complications: Secondary | ICD-10-CM | POA: Diagnosis not present

## 2019-09-19 DIAGNOSIS — K219 Gastro-esophageal reflux disease without esophagitis: Secondary | ICD-10-CM | POA: Diagnosis not present

## 2019-09-19 DIAGNOSIS — E669 Obesity, unspecified: Secondary | ICD-10-CM | POA: Insufficient documentation

## 2019-09-19 DIAGNOSIS — Z90722 Acquired absence of ovaries, bilateral: Secondary | ICD-10-CM | POA: Insufficient documentation

## 2019-09-19 DIAGNOSIS — E7800 Pure hypercholesterolemia, unspecified: Secondary | ICD-10-CM | POA: Insufficient documentation

## 2019-09-19 DIAGNOSIS — N183 Chronic kidney disease, stage 3 unspecified: Secondary | ICD-10-CM | POA: Diagnosis not present

## 2019-09-19 DIAGNOSIS — Z79899 Other long term (current) drug therapy: Secondary | ICD-10-CM | POA: Diagnosis not present

## 2019-09-19 DIAGNOSIS — Z7982 Long term (current) use of aspirin: Secondary | ICD-10-CM | POA: Insufficient documentation

## 2019-09-19 DIAGNOSIS — E785 Hyperlipidemia, unspecified: Secondary | ICD-10-CM | POA: Diagnosis not present

## 2019-09-19 DIAGNOSIS — Z90710 Acquired absence of both cervix and uterus: Secondary | ICD-10-CM | POA: Insufficient documentation

## 2019-09-19 DIAGNOSIS — C541 Malignant neoplasm of endometrium: Secondary | ICD-10-CM | POA: Insufficient documentation

## 2019-09-19 NOTE — Patient Instructions (Signed)
Please notify Dr Denman George at phone number [phone] if you notice vaginal bleeding, new pelvic or abdominal pains, bloating, feeling full easy, or a change in bladder or bowel function.   Dr Denman George will see you again in 6 months time as scheduled.  You have evidence of diverticular disease in the colon which can lead to diverticulitis (colon inflammation). The best advice is to keep your stools soft and regular to avoid this.

## 2019-09-19 NOTE — Progress Notes (Signed)
Follow-up Note: Gyn-Onc  Consult was requested by Dr. Nori Riis for the evaluation of GRACIELLA CREMER 73 y.o. female  CC:  Chief Complaint  Patient presents with  . Endometrial cancer [facility])    Assessment/Plan:  Ms. KEYONTA KLEHR  is a 73 y.o.  year old with stage IA grade 1 endometrioid endometrial cancer s/p robotic assisted hysterectomy, BSO and staging on 09/06/16.  Pathology revealed low risk factors for recurrence, therefore no adjuvant therapy is recommended according to NCCN guidelines.  I discussed risk for recurrence and typical symptoms encouraged her to notify us of these should they develop between visits.  I recommend she continue follow-up every 6 months for 5 years in accordance with NCCN guidelines. Those visits should include symptom assessment, physical exam and pelvic examination. Pap smears are not indicated or recommended in the routine surveillance of endometrial cancer.  She will follow-up with me in 6 months as Dr Nori Riis is retiring.  HPI: Bonnye Sesto is a 73 year old G2P2 who is seen in consultation at the request of Dr Nori Riis for endometrial cancer.    The patient reports a single episode of postmenopausal bleeding in September, 2017. Dr Nori Riis performed an [place] on 07/14/16 which showed a 5.6x3.1x3.8cm uterus with a 1.4cm endometrial stripe (no adnexal masses). A hysteroscopy and D&C was performed on 08/03/16 which revealed endometrial adenocarcinoma associated with CAH (FIGO grade 1).  The patient is obese and has DM (HbA1C 6%) on metformin. The patient has HTN.  The patient underwent robotic assisted total hysterectomy, BSO and SLN biopsy on 09/06/16. Surgery was uncomplicated. Final pathology revealed a grade 1 cancer with no myometrial invasion. It measured 2.6cm. No LVSI, cervical involvement and adnexal or nodal involvement was seen. In accordance with NCCN guidelines no adjuvant therapy was recommended.  Interval Hx:  When she was seen in June 2020 she reported  abdominal pains which prompted ordering of a CT scan of the abdomen and pelvis which was performed on April 09, 2019.  This revealed no acute findings within the abdomen and pelvis including no evidence of recurrent or metastatic disease.  There is mild colonic diverticulosis seen without diverticulitis.  Since then she has done well with no specific concerning symptoms for recurrence.   Current Meds:  Outpatient Encounter Medications as of 09/19/2019  Medication Sig  . ACCU-CHEK AVIVA PLUS test strip 1 each by Other route daily.   Marland Kitchen ACCU-CHEK FASTCLIX LANCETS MISC 1 each by Other route daily.   Marland Kitchen amLODipine (NORVASC) 5 MG tablet Take 5 mg by mouth daily.  Marland Kitchen aspirin EC 81 MG tablet Take 81 mg by mouth every evening.  . clobetasol cream (TEMOVATE) [id number] % Apply 1 application topically 2 (two) times daily as needed (eczema).  . metFORMIN (GLUCOPHAGE) 500 MG tablet Take 500 mg by mouth 2 (two) times daily.   . NON FORMULARY Place 1 drop into the left eye 3 (three) times daily. Pred-Gati-Brom (Prednisolone Acetate/Gatifloxacin/Bromfenac)  . omeprazole (PRILOSEC) 20 MG capsule Take 20 mg by mouth daily.  . pravastatin (PRAVACHOL) 20 MG tablet Take 20 mg by mouth every evening.   Marland Kitchen PRESCRIPTION MEDICATION Place 1 drop into the right eye 3 (three) times daily. Pred-Brom (Prednisolone Phosphate/Bromfenac)  . valsartan-hydrochlorothiazide (DIOVAN-HCT) 160-25 MG tablet Take 1 tablet by mouth daily.   No facility-administered encounter medications on file as of 09/19/2019.    Allergy: No Known Allergies  Social Hx:   Social History   Socioeconomic History  . Marital status: Married  Spouse name: Broadus John  . Number of children: Not on file  . Years of education: Not on file  . Highest education level: Not on file  Occupational History  . Not on file  Tobacco Use  . Smoking status: Never Smoker  . Smokeless tobacco: Never Used  Substance and Sexual Activity  . Alcohol use: No  . Drug use:  No  . Sexual activity: Yes  Other Topics Concern  . Not on file  Social History Narrative  . Not on file   Social Determinants of Health   Financial Resource Strain:   . Difficulty of Paying Living Expenses: Not on file  Food Insecurity:   . Worried About Charity fundraiser in the Last Year: Not on file  . Ran Out of Food in the Last Year: Not on file  Transportation Needs:   . Lack of Transportation (Medical): Not on file  . Lack of Transportation (Non-Medical): Not on file  Physical Activity:   . Days of Exercise per Week: Not on file  . Minutes of Exercise per Session: Not on file  Stress:   . Feeling of Stress : Not on file  Social Connections:   . Frequency of Communication with Friends and Family: Not on file  . Frequency of Social Gatherings with Friends and Family: Not on file  . Attends Religious Services: Not on file  . Active Member of Clubs or Organizations: Not on file  . Attends Archivist Meetings: Not on file  . Marital Status: Not on file  Intimate Partner Violence:   . Fear of Current or Ex-Partner: Not on file  . Emotionally Abused: Not on file  . Physically Abused: Not on file  . Sexually Abused: Not on file    Past Surgical Hx:  Past Surgical History:  Procedure Laterality Date  . ABDOMINAL HYSTERECTOMY    . CATARACT EXTRACTION W/PHACO Right 06/04/2018   Procedure: CATARACT EXTRACTION PHACO AND INTRAOCULAR LENS PLACEMENT RIGHT EYE CDE=7.64;  Surgeon: Tonny Branch, MD;  Location: [facility];  Service: Ophthalmology;  Laterality: Right;  right  . CATARACT EXTRACTION W/PHACO Left 07/09/2018   Procedure: CATARACT EXTRACTION PHACO AND INTRAOCULAR LENS PLACEMENT (IOC);  Surgeon: Tonny Branch, MD;  Location: [facility];  Service: Ophthalmology;  Laterality: Left;  CDE: 5.53  . COLONOSCOPY  2005  . COLONOSCOPY N/A 11/28/2013   Procedure: COLONOSCOPY;  Surgeon: Rogene Houston, MD;  [facility];  Service: Endoscopy;  Laterality: N/A;  830  . Polyp  removed from uterus    . ROBOTIC ASSISTED LAP VAGINAL HYSTERECTOMY N/A 09/06/2016   Procedure: XI ROBOTIC ASSISTED LAPAROSCOPIC TOTAL HYSTERECTOMY;  Surgeon: Everitt Amber, MD;  [facility]: [facility];  Service: Gynecology;  Laterality: N/A;  . ROBOTIC ASSISTED SALPINGO OOPHERECTOMY Bilateral 09/06/2016   Procedure: XI ROBOTIC ASSISTED SALPINGO OOPHORECTOMY;  Surgeon: Everitt Amber, MD;  [facility]: [facility];  Service: Gynecology;  Laterality: Bilateral;  . SENTINEL NODE BIOPSY N/A 09/06/2016   Procedure: SENTINEL NODE BIOPSY;  Surgeon: Everitt Amber, MD;  [facility];  Service: Gynecology;  Laterality: N/A;    Past Medical Hx:  Past Medical History:  Diagnosis Date  . Diabetes mellitus without complication (HCC)    NIDDM x 1 yr  . GERD (gastroesophageal reflux disease)   . Hx of dysplastic nevus 02/2018   L buttock  . Hypercholesteremia   . Hypertension   . Uterine cancer ([place]) 08/14/2016    Past Gynecological History:  G2P2 SVD x 2 No  LMP recorded. Patient has had a hysterectomy.  Family Hx:  Family History  Problem Relation Age of Onset  . Diabetes Father   . Diabetes Brother   . Colon cancer Neg Hx     Review of Systems:  Constitutional  Feels well,    ENT Normal appearing ears and nares bilaterally Skin/Breast  No rash, sores, jaundice, itching, dryness Cardiovascular  No chest pain, shortness of breath, or edema  Pulmonary  No cough or wheeze.  Gastro Intestinal  No nausea, vomitting, or diarrhoea. No bright red blood per rectum, change in bowel movement, or constipation.  + left upper quadrant pains  Genito Urinary  No frequency, urgency, dysuria, no postmenopausal bleeding Musculo Skeletal  No myalgia, arthralgia, joint swelling or pain  Neurologic  No weakness, numbness, change in gait,  Psychology  No depression, anxiety, insomnia.   Vitals:  Blood pressure (!) 155/84, pulse 77, temperature 97.9 F (36.6 C), temperature source Temporal, resp. rate 17, height 5\' 2"   (1.575 m), weight 182 lb (82.6 kg), SpO2 98 %.  Physical Exam: WD in NAD Neck  Supple NROM, without any enlargements.  Lymph Node Survey No cervical supraclavicular or inguinal adenopathy Cardiovascular  Pulse normal rate, regularity and rhythm. S1 and S2 normal.  Lungs  Clear to auscultation bilateraly, without wheezes/crackles/rhonchi. Good air movement.  Skin  No rash/lesions/breakdown  Psychiatry  Alert and oriented to person, place, and time  Abdomen  Normoactive bowel sounds, abdomen soft, non-tender and obese without evidence of hernia. Well healed incisions. No palpable lesion in the left upper quadrant.  Back No CVA tenderness Genito Urinary: surgically absent uterus cervix. Vaginal cuff smooth and free of masses and blood. No lesions. Rectal  deferred Extremities  No bilateral cyanosis, clubbing or edema.   Thereasa Solo, MD  09/19/2019, 2:18 PM

## 2019-09-26 DIAGNOSIS — N1832 Chronic kidney disease, stage 3b: Secondary | ICD-10-CM | POA: Diagnosis not present

## 2019-09-26 DIAGNOSIS — E785 Hyperlipidemia, unspecified: Secondary | ICD-10-CM | POA: Diagnosis not present

## 2019-09-26 DIAGNOSIS — E1122 Type 2 diabetes mellitus with diabetic chronic kidney disease: Secondary | ICD-10-CM | POA: Diagnosis not present

## 2019-09-26 DIAGNOSIS — I10 Essential (primary) hypertension: Secondary | ICD-10-CM | POA: Diagnosis not present

## 2019-09-27 ENCOUNTER — Other Ambulatory Visit (HOSPITAL_COMMUNITY): Payer: Self-pay | Admitting: Internal Medicine

## 2019-09-27 DIAGNOSIS — Z780 Asymptomatic menopausal state: Secondary | ICD-10-CM

## 2019-10-10 ENCOUNTER — Other Ambulatory Visit: Payer: Self-pay

## 2019-10-10 ENCOUNTER — Ambulatory Visit (HOSPITAL_COMMUNITY)
Admission: RE | Admit: 2019-10-10 | Discharge: 2019-10-10 | Disposition: A | Discharge status: Home / Self Care | Payer: Medicare Other | Source: Ambulatory Visit | Attending: Internal Medicine | Admitting: Internal Medicine

## 2019-10-10 DIAGNOSIS — M8589 Other specified disorders of bone density and structure, multiple sites: Secondary | ICD-10-CM | POA: Diagnosis not present

## 2019-10-10 DIAGNOSIS — Z780 Asymptomatic menopausal state: Secondary | ICD-10-CM

## 2019-10-30 ENCOUNTER — Other Ambulatory Visit (HOSPITAL_COMMUNITY): Payer: Medicare Other

## 2019-10-30 DIAGNOSIS — E119 Type 2 diabetes mellitus without complications: Secondary | ICD-10-CM | POA: Diagnosis not present

## 2019-11-06 DIAGNOSIS — C44311 Basal cell carcinoma of skin of nose: Secondary | ICD-10-CM | POA: Diagnosis not present

## 2019-11-06 DIAGNOSIS — M950 Acquired deformity of nose: Secondary | ICD-10-CM | POA: Diagnosis not present

## 2019-11-06 DIAGNOSIS — L814 Other melanin hyperpigmentation: Secondary | ICD-10-CM | POA: Diagnosis not present

## 2019-11-06 DIAGNOSIS — L988 Other specified disorders of the skin and subcutaneous tissue: Secondary | ICD-10-CM | POA: Diagnosis not present

## 2019-11-06 DIAGNOSIS — L578 Other skin changes due to chronic exposure to nonionizing radiation: Secondary | ICD-10-CM | POA: Diagnosis not present

## 2019-11-07 ENCOUNTER — Other Ambulatory Visit (HOSPITAL_COMMUNITY): Payer: Medicare Other

## 2019-12-23 DIAGNOSIS — H26493 Other secondary cataract, bilateral: Secondary | ICD-10-CM | POA: Diagnosis not present

## 2019-12-23 DIAGNOSIS — H353121 Nonexudative age-related macular degeneration, left eye, early dry stage: Secondary | ICD-10-CM | POA: Diagnosis not present

## 2019-12-23 DIAGNOSIS — H26492 Other secondary cataract, left eye: Secondary | ICD-10-CM | POA: Diagnosis not present

## 2020-01-02 DIAGNOSIS — Z48810 Encounter for surgical aftercare following surgery on the sense organs: Secondary | ICD-10-CM | POA: Diagnosis not present

## 2020-01-02 DIAGNOSIS — H353121 Nonexudative age-related macular degeneration, left eye, early dry stage: Secondary | ICD-10-CM | POA: Diagnosis not present

## 2020-01-17 DIAGNOSIS — N183 Chronic kidney disease, stage 3 unspecified: Secondary | ICD-10-CM | POA: Diagnosis not present

## 2020-01-17 DIAGNOSIS — E785 Hyperlipidemia, unspecified: Secondary | ICD-10-CM | POA: Diagnosis not present

## 2020-01-17 DIAGNOSIS — Z79899 Other long term (current) drug therapy: Secondary | ICD-10-CM | POA: Diagnosis not present

## 2020-01-17 DIAGNOSIS — E1129 Type 2 diabetes mellitus with other diabetic kidney complication: Secondary | ICD-10-CM | POA: Diagnosis not present

## 2020-01-17 DIAGNOSIS — I10 Essential (primary) hypertension: Secondary | ICD-10-CM | POA: Diagnosis not present

## 2020-01-24 DIAGNOSIS — I10 Essential (primary) hypertension: Secondary | ICD-10-CM | POA: Diagnosis not present

## 2020-01-24 DIAGNOSIS — N1832 Chronic kidney disease, stage 3b: Secondary | ICD-10-CM | POA: Diagnosis not present

## 2020-01-24 DIAGNOSIS — E1122 Type 2 diabetes mellitus with diabetic chronic kidney disease: Secondary | ICD-10-CM | POA: Diagnosis not present

## 2020-03-20 ENCOUNTER — Inpatient Hospital Stay: Payer: Medicare Other | Attending: Gynecologic Oncology | Admitting: Gynecologic Oncology

## 2020-03-20 ENCOUNTER — Encounter: Payer: Self-pay | Admitting: Gynecologic Oncology

## 2020-03-20 ENCOUNTER — Other Ambulatory Visit: Payer: Self-pay

## 2020-03-20 VITALS — BP 157/56 | HR 77 | Temp 98.2°F | Resp 18 | Ht 62.0 in | Wt 181.6 lb

## 2020-03-20 DIAGNOSIS — E7800 Pure hypercholesterolemia, unspecified: Secondary | ICD-10-CM | POA: Insufficient documentation

## 2020-03-20 DIAGNOSIS — Z90722 Acquired absence of ovaries, bilateral: Secondary | ICD-10-CM

## 2020-03-20 DIAGNOSIS — Z7982 Long term (current) use of aspirin: Secondary | ICD-10-CM | POA: Diagnosis not present

## 2020-03-20 DIAGNOSIS — C541 Malignant neoplasm of endometrium: Secondary | ICD-10-CM

## 2020-03-20 DIAGNOSIS — Z08 Encounter for follow-up examination after completed treatment for malignant neoplasm: Secondary | ICD-10-CM

## 2020-03-20 DIAGNOSIS — Z79899 Other long term (current) drug therapy: Secondary | ICD-10-CM | POA: Diagnosis not present

## 2020-03-20 DIAGNOSIS — E669 Obesity, unspecified: Secondary | ICD-10-CM | POA: Insufficient documentation

## 2020-03-20 DIAGNOSIS — Z7984 Long term (current) use of oral hypoglycemic drugs: Secondary | ICD-10-CM | POA: Insufficient documentation

## 2020-03-20 DIAGNOSIS — E119 Type 2 diabetes mellitus without complications: Secondary | ICD-10-CM | POA: Diagnosis not present

## 2020-03-20 DIAGNOSIS — I10 Essential (primary) hypertension: Secondary | ICD-10-CM | POA: Diagnosis not present

## 2020-03-20 DIAGNOSIS — Z90710 Acquired absence of both cervix and uterus: Secondary | ICD-10-CM | POA: Diagnosis not present

## 2020-03-20 DIAGNOSIS — Z8542 Personal history of malignant neoplasm of other parts of uterus: Secondary | ICD-10-CM

## 2020-03-20 DIAGNOSIS — K219 Gastro-esophageal reflux disease without esophagitis: Secondary | ICD-10-CM | POA: Diagnosis not present

## 2020-03-20 NOTE — Progress Notes (Signed)
Follow-up Note: Gyn-Onc  Consult was requested by Dr. Nori Riis for the evaluation of Jennifer Morrison 74 y.o. female  CC:  Chief Complaint  Patient presents with  . Endometrial cancer ([place])    Follow up   Assessment/Plan:  [name]  is a 74 y.o.  year old with stage IA grade 1 endometrioid endometrial cancer s/p robotic assisted hysterectomy, BSO and staging on 09/06/16.  Pathology revealed low risk factors for recurrence, therefore no adjuvant therapy is recommended according to NCCN guidelines.  I discussed risk for recurrence and typical symptoms encouraged her to notify us of these should they develop between visits.  I recommend she continue follow-up every 6 months for 5 years in accordance with NCCN guidelines. Those visits should include symptom assessment, physical exam and pelvic examination. Pap smears are not indicated or recommended in the routine surveillance of endometrial cancer.  She will follow-up with me in 6 months after which she will graduate from cancer surveillance as she is 5 years out.   HPI: Jennifer Morrison is a 74 year old G2P2 who is seen in consultation at the request of Dr Nori Riis for endometrial cancer.    The patient reports a single episode of postmenopausal bleeding in September, 2017. Dr Nori Riis performed an [place] on 07/14/16 which showed a 5.6x3.1x3.8cm uterus with a 1.4cm endometrial stripe (no adnexal masses). A hysteroscopy and D&C was performed on 08/03/16 which revealed endometrial adenocarcinoma associated with CAH (FIGO grade 1).  The patient is obese and has DM (HbA1C 6%) on metformin. The patient has HTN.  The patient underwent robotic assisted total hysterectomy, BSO and SLN biopsy on 09/06/16. Surgery was uncomplicated. Final pathology revealed a grade 1 cancer with no myometrial invasion. It measured 2.6cm. No LVSI, cervical involvement and adnexal or nodal involvement was seen. In accordance with NCCN guidelines no adjuvant therapy was  recommended.  When she was seen in June 2020 she reported abdominal pains which prompted ordering of a CT scan of the abdomen and pelvis which was performed on April 09, 2019.  This revealed no acute findings within the abdomen and pelvis including no evidence of recurrent or metastatic disease.  There is mild colonic diverticulosis seen without diverticulitis.  Interval Hx:  Since then she has done well with no specific concerning symptoms for recurrence.   Current Meds:  Outpatient Encounter Medications as of 03/20/2020  Medication Sig  . ACCU-CHEK AVIVA PLUS test strip 1 each by Other route daily.   Marland Kitchen ACCU-CHEK FASTCLIX LANCETS MISC 1 each by Other route daily.   Marland Kitchen amLODipine (NORVASC) 5 MG tablet Take 5 mg by mouth daily.  Marland Kitchen aspirin EC 81 MG tablet Take 81 mg by mouth every evening.  . clobetasol cream (TEMOVATE) 1.61 % Apply 1 application topically 2 (two) times daily as needed (eczema).  . metFORMIN (GLUCOPHAGE) 500 MG tablet Take 500 mg by mouth 2 (two) times daily.   . NON FORMULARY Place 1 drop into the left eye 3 (three) times daily. Pred-Gati-Brom (Prednisolone Acetate/Gatifloxacin/Bromfenac)  . omeprazole (PRILOSEC) 20 MG capsule Take 20 mg by mouth daily.  . pravastatin (PRAVACHOL) 20 MG tablet Take 20 mg by mouth every evening.   Marland Kitchen PRESCRIPTION MEDICATION Place 1 drop into the right eye 3 (three) times daily. Pred-Brom (Prednisolone Phosphate/Bromfenac)  . valsartan-hydrochlorothiazide (DIOVAN-HCT) 160-25 MG tablet Take 1 tablet by mouth daily.   No facility-administered encounter medications on file as of 03/20/2020.    Allergy: No Known Allergies  Social Hx:  Social History   Socioeconomic History  . Marital status: Married    Spouse name: Broadus John  . Number of children: Not on file  . Years of education: Not on file  . Highest education level: Not on file  Occupational History  . Not on file  Tobacco Use  . Smoking status: Never Smoker  . Smokeless tobacco:  Never Used  Vaping Use  . Vaping Use: Never used  Substance and Sexual Activity  . Alcohol use: No  . Drug use: No  . Sexual activity: Yes  Other Topics Concern  . Not on file  Social History Narrative  . Not on file   Social Determinants of Health   Financial Resource Strain:   . Difficulty of Paying Living Expenses:   Food Insecurity:   . Worried About Charity fundraiser in the Last Year:   . Arboriculturist in the Last Year:   Transportation Needs:   . Film/video editor (Medical):   Marland Kitchen Lack of Transportation (Non-Medical):   Physical Activity:   . Days of Exercise per Week:   . Minutes of Exercise per Session:   Stress:   . Feeling of Stress :   Social Connections:   . Frequency of Communication with Friends and Family:   . Frequency of Social Gatherings with Friends and Family:   . Attends Religious Services:   . Active Member of Clubs or Organizations:   . Attends Archivist Meetings:   Marland Kitchen Marital Status:   Intimate Partner Violence:   . Fear of Current or Ex-Partner:   . Emotionally Abused:   Marland Kitchen Physically Abused:   . Sexually Abused:     Past Surgical Hx:  Past Surgical History:  Procedure Laterality Date  . ABDOMINAL HYSTERECTOMY    . CATARACT EXTRACTION W/PHACO Right 06/04/2018   Procedure: CATARACT EXTRACTION PHACO AND INTRAOCULAR LENS PLACEMENT RIGHT EYE CDE=7.64;  Surgeon: Tonny Branch, MD;  Location: [facility];  Service: Ophthalmology;  Laterality: Right;  right  . CATARACT EXTRACTION W/PHACO Left 07/09/2018   Procedure: CATARACT EXTRACTION PHACO AND INTRAOCULAR LENS PLACEMENT (IOC);  Surgeon: Tonny Branch, MD;  Location: [facility];  Service: Ophthalmology;  Laterality: Left;  CDE: 5.53  . COLONOSCOPY  2005  . COLONOSCOPY N/A 11/28/2013   Procedure: COLONOSCOPY;  Surgeon: Rogene Houston, MD;  Location: [facility];  Service: Endoscopy;  Laterality: N/A;  830  . Polyp removed from uterus    . ROBOTIC ASSISTED LAP VAGINAL HYSTERECTOMY N/A  09/06/2016   Procedure: XI ROBOTIC ASSISTED LAPAROSCOPIC TOTAL HYSTERECTOMY;  Surgeon: Everitt Amber, MD;  Location: [facility];  Service: Gynecology;  Laterality: N/A;  . ROBOTIC ASSISTED SALPINGO OOPHERECTOMY Bilateral 09/06/2016   Procedure: XI ROBOTIC ASSISTED SALPINGO OOPHORECTOMY;  Surgeon: Everitt Amber, MD;  Location: [facility];  Service: Gynecology;  Laterality: Bilateral;  . SENTINEL NODE BIOPSY N/A 09/06/2016   Procedure: SENTINEL NODE BIOPSY;  Surgeon: Everitt Amber, MD;  [facility];  Service: Gynecology;  Laterality: N/A;    Past Medical Hx:  Past Medical History:  Diagnosis Date  . Diabetes mellitus without complication (HCC)    NIDDM x 1 yr  . GERD (gastroesophageal reflux disease)   . Hx of dysplastic nevus 02/2018   L buttock  . Hypercholesteremia   . Hypertension   . Uterine cancer ([place]) 08/14/2016    Past Gynecological History:  G2P2 SVD x 2 No LMP recorded. Patient has had a hysterectomy.  Family Hx:  Family History  Problem Relation Age of Onset  . Diabetes Father   . Diabetes Brother   . Colon cancer Neg Hx     Review of Systems:  Constitutional  Feels well,    ENT Normal appearing ears and nares bilaterally Skin/Breast  No rash, sores, jaundice, itching, dryness Cardiovascular  No chest pain, shortness of breath, or edema  Pulmonary  No cough or wheeze.  Gastro Intestinal  No nausea, vomitting, or diarrhoea. No bright red blood per rectum, change in bowel movement, or constipation.  + left upper quadrant pains  Genito Urinary  No frequency, urgency, dysuria, no postmenopausal bleeding Musculo Skeletal  No myalgia, arthralgia, joint swelling or pain  Neurologic  No weakness, numbness, change in gait,  Psychology  No depression, anxiety, insomnia.   Vitals:  Blood pressure (!) 157/56, pulse 77, temperature 98.2 F (36.8 C), temperature source Oral, resp. rate 18, height 5\' 2"  (1.575 m), weight 181 lb 9.6 oz (82.4 kg), SpO2 99 %.  Physical Exam: WD  in NAD Neck  Supple NROM, without any enlargements.  Lymph Node Survey No cervical supraclavicular or inguinal adenopathy Cardiovascular  Pulse normal rate, regularity and rhythm. S1 and S2 normal.  Lungs  Clear to auscultation bilateraly, without wheezes/crackles/rhonchi. Good air movement.  Skin  No rash/lesions/breakdown  Psychiatry  Alert and oriented to person, place, and time  Abdomen  Normoactive bowel sounds, abdomen soft, non-tender and obese without evidence of hernia. Well healed incisions. No palpable lesion in the left upper quadrant.  Back No CVA tenderness Genito Urinary: surgically absent uterus cervix. Vaginal cuff smooth and free of masses and blood. No lesions. Rectal  deferred Extremities  No bilateral cyanosis, clubbing or edema.   Thereasa Solo, MD  03/20/2020, 2:11 PM

## 2020-03-20 NOTE — Patient Instructions (Signed)
Please notify Dr Denman George at phone number [phone] if you notice vaginal bleeding, new pelvic or abdominal pains, bloating, feeling full easy, or a change in bladder or bowel function.   Please return to see Dr Denman George in December.

## 2020-05-06 ENCOUNTER — Encounter: Payer: Medicare Other | Admitting: Dermatology

## 2020-05-11 ENCOUNTER — Encounter: Payer: Self-pay | Admitting: Dermatology

## 2020-05-11 ENCOUNTER — Other Ambulatory Visit: Payer: Self-pay

## 2020-05-11 ENCOUNTER — Ambulatory Visit (INDEPENDENT_AMBULATORY_CARE_PROVIDER_SITE_OTHER): Payer: Medicare Other | Admitting: Dermatology

## 2020-05-11 DIAGNOSIS — D2362 Other benign neoplasm of skin of left upper limb, including shoulder: Secondary | ICD-10-CM | POA: Diagnosis not present

## 2020-05-11 DIAGNOSIS — D239 Other benign neoplasm of skin, unspecified: Secondary | ICD-10-CM

## 2020-05-11 DIAGNOSIS — L219 Seborrheic dermatitis, unspecified: Secondary | ICD-10-CM

## 2020-05-11 DIAGNOSIS — Z1283 Encounter for screening for malignant neoplasm of skin: Secondary | ICD-10-CM | POA: Diagnosis not present

## 2020-05-11 DIAGNOSIS — L309 Dermatitis, unspecified: Secondary | ICD-10-CM

## 2020-05-11 DIAGNOSIS — L821 Other seborrheic keratosis: Secondary | ICD-10-CM

## 2020-05-11 DIAGNOSIS — Z85828 Personal history of other malignant neoplasm of skin: Secondary | ICD-10-CM | POA: Diagnosis not present

## 2020-05-11 DIAGNOSIS — L814 Other melanin hyperpigmentation: Secondary | ICD-10-CM

## 2020-05-11 DIAGNOSIS — D229 Melanocytic nevi, unspecified: Secondary | ICD-10-CM

## 2020-05-11 DIAGNOSIS — D489 Neoplasm of uncertain behavior, unspecified: Secondary | ICD-10-CM

## 2020-05-11 DIAGNOSIS — D485 Neoplasm of uncertain behavior of skin: Secondary | ICD-10-CM | POA: Diagnosis not present

## 2020-05-11 DIAGNOSIS — L578 Other skin changes due to chronic exposure to nonionizing radiation: Secondary | ICD-10-CM

## 2020-05-11 DIAGNOSIS — L853 Xerosis cutis: Secondary | ICD-10-CM

## 2020-05-11 DIAGNOSIS — D1800 Hemangioma unspecified site: Secondary | ICD-10-CM

## 2020-05-11 NOTE — Progress Notes (Signed)
Follow-Up Visit   Subjective  Jennifer Morrison is a 74 y.o. female who presents for the following: TBSE (AK left shin and bx proven BCC right nose).  Patient presents today for Follow up on OV 09/12/19 for AK on Left shin and bx proven BCC treated with Moh's Right nasal facial angle near eyelid.  The following portions of the chart were reviewed this encounter and updated as appropriate:  Tobacco  Allergies  Meds  Problems  Med Hx  Surg Hx  Fam Hx      Review of Systems:  No other skin or systemic complaints except as noted in HPI or Assessment and Plan.  Objective  Well appearing patient in no apparent distress; mood and affect are within normal limits.  A full examination was performed including scalp, head, eyes, ears, nose, lips, neck, chest, axillae, abdomen, back, buttocks, bilateral upper extremities, bilateral lower extremities, hands, feet, fingers, toes, fingernails, and toenails. All findings within normal limits unless otherwise noted below.  Objective  Right nasal facial angle near eyelid: Well healed scar with no evidence of recurrence.   Objective  Left Shoulder - Anterior: Firm pink/brown papulenodule with dimple sign.   Objective  Scalp: White scaly plaques  Objective  Right Lower Leg - Posterior: 0.6 cm pink scar-like papule       Objective  Left Foot - Anterior: Scaly pink plaques at feet    Assessment & Plan  History of basal cell carcinoma (BCC) Right nasal facial angle near eyelid  Clear. Observe for recurrence. Call clinic for new or changing lesions.  Recommend regular skin exams, daily broad-spectrum spf 30+ sunscreen use, and photoprotection.    Recommend using a silicone scar cream like Serica scar cream   Dermatofibroma Left Shoulder - Anterior  Benign, observe.    Seborrheic dermatitis Scalp  Chronic.   Start OTC Head and Shoulders 3 times week leave in for 10 min.  Neoplasm of uncertain behavior Right Lower Leg  - Posterior  By history more raised last week and now improving.   Will recheck in 1 month  Differential Diagnosis: Resolving bite reaction vs folliculitis over dermatofibroma or early SCCis  Eczema, unspecified type Left Foot - Anterior  Continue Clobetasol ointment prn as needed  Start ammonium lactate cream  Reviewed chronic nature. Currently flared.  Topical steroids (such as triamcinolone, fluocinolone, fluocinonide, mometasone, clobetasol, halobetasol, betamethasone, hydrocortisone) can cause thinning and lightening of the skin if they are used for too long in the same area. Your physician has selected the right strength medicine for your problem and area affected on the body. Please use your medication only as directed by your physician to prevent side effects.   Avoid applying to face, groin, and axilla. Use as directed. Risk of skin atrophy with long-term use reviewed.        Lentigines - Scattered tan macules - Discussed due to sun exposure - Benign, observe - Call for any changes  Seborrheic Keratoses - Stuck-on, waxy, tan-brown papules and plaques  - Discussed benign etiology and prognosis. - Observe - Call for any changes  Melanocytic Nevi - Tan-brown and/or pink-flesh-colored symmetric macules and papules - Benign appearing on exam today - Observation - Call clinic for new or changing moles - Recommend daily use of broad spectrum spf 30+ sunscreen to sun-exposed areas.   Hemangiomas - Red papules - Discussed benign nature - Observe - Call for any changes  Actinic Damage - diffuse scaly erythematous macules with underlying dyspigmentation -  Recommend daily broad spectrum sunscreen SPF 30+ to sun-exposed areas, reapply every 2 hours as needed.  - Call for new or changing lesions.  Xerosis - diffuse xerotic patches - recommend gentle, hydrating skin care - gentle skin care handout given   Skin cancer screening performed today.   Return in  about 1 month (around 06/11/2020).   IDonzetta Kohut, CMA, am acting as scribe for [name], MD .  Documentation: I have reviewed the above documentation for accuracy and completeness, and I agree with the above.  [name], MD

## 2020-05-11 NOTE — Patient Instructions (Addendum)
Recommend daily broad spectrum sunscreen SPF 30+ to sun-exposed areas, reapply every 2 hours as needed. Call for new or changing lesions.  Topical steroids (such as triamcinolone, fluocinolone, fluocinonide, mometasone, clobetasol, halobetasol, betamethasone, hydrocortisone) can cause thinning and lightening of the skin if they are used for too long in the same area. Your physician has selected the right strength medicine for your problem and area affected on the body. Please use your medication only as directed by your physician to prevent side effects.  Avoid applying to face, groin, and axilla. Use as directed. Risk of skin atrophy with long-term use reviewed.   Gentle Skin Care Guide  1. Bathe no more than once a day.  2. Avoid bathing in hot water  3. Use a mild soap like Dove, Vanicream, Cetaphil, CeraVe. Can use Lever 2000 or Cetaphil antibacterial soap  4. Use soap only where you need it. On most days, use it under your arms, between your legs, and on your feet. Let the water rinse other areas unless visibly dirty.  5. When you get out of the bath/shower, use a towel to gently blot your skin dry, don't rub it.  6. While your skin is still a little damp, apply a moisturizing cream such as Vanicream, CeraVe, Cetaphil, Eucerin, Sarna lotion or plain Vaseline Jelly. For hands apply Neutrogena [place] Hand Cream or Excipial Hand Cream.  7. Reapply moisturizer any time you start to itch or feel dry.  8. Sometimes using free and clear laundry detergents can be helpful. Fabric softener sheets should be avoided. Downy Free & Gentle liquid, or any liquid fabric softener that is free of dyes and perfumes, it acceptable to use  9. If your doctor has given you prescription creams you may apply moisturizers over them

## 2020-05-18 DIAGNOSIS — I10 Essential (primary) hypertension: Secondary | ICD-10-CM | POA: Diagnosis not present

## 2020-05-18 DIAGNOSIS — Z79899 Other long term (current) drug therapy: Secondary | ICD-10-CM | POA: Diagnosis not present

## 2020-05-18 DIAGNOSIS — E1129 Type 2 diabetes mellitus with other diabetic kidney complication: Secondary | ICD-10-CM | POA: Diagnosis not present

## 2020-05-18 DIAGNOSIS — I700 Atherosclerosis of aorta: Secondary | ICD-10-CM | POA: Diagnosis not present

## 2020-05-18 DIAGNOSIS — N183 Chronic kidney disease, stage 3 unspecified: Secondary | ICD-10-CM | POA: Diagnosis not present

## 2020-05-19 ENCOUNTER — Encounter: Payer: Self-pay | Admitting: Dermatology

## 2020-05-25 DIAGNOSIS — I10 Essential (primary) hypertension: Secondary | ICD-10-CM | POA: Diagnosis not present

## 2020-05-25 DIAGNOSIS — N1832 Chronic kidney disease, stage 3b: Secondary | ICD-10-CM | POA: Diagnosis not present

## 2020-05-25 DIAGNOSIS — E1122 Type 2 diabetes mellitus with diabetic chronic kidney disease: Secondary | ICD-10-CM | POA: Diagnosis not present

## 2020-06-09 ENCOUNTER — Ambulatory Visit: Payer: Medicare Other | Admitting: Dermatology

## 2020-06-10 ENCOUNTER — Ambulatory Visit: Payer: Medicare Other | Admitting: Dermatology

## 2020-06-17 ENCOUNTER — Other Ambulatory Visit (HOSPITAL_COMMUNITY): Payer: Self-pay | Admitting: Internal Medicine

## 2020-06-17 DIAGNOSIS — Z1231 Encounter for screening mammogram for malignant neoplasm of breast: Secondary | ICD-10-CM

## 2020-07-13 DIAGNOSIS — Z961 Presence of intraocular lens: Secondary | ICD-10-CM | POA: Diagnosis not present

## 2020-07-13 DIAGNOSIS — H353121 Nonexudative age-related macular degeneration, left eye, early dry stage: Secondary | ICD-10-CM | POA: Diagnosis not present

## 2020-07-13 DIAGNOSIS — H26491 Other secondary cataract, right eye: Secondary | ICD-10-CM | POA: Diagnosis not present

## 2020-07-17 ENCOUNTER — Ambulatory Visit (HOSPITAL_COMMUNITY)
Admission: RE | Admit: 2020-07-17 | Discharge: 2020-07-17 | Disposition: A | Discharge status: Home / Self Care | Payer: Medicare Other | Source: Ambulatory Visit | Attending: Internal Medicine | Admitting: Internal Medicine

## 2020-07-17 ENCOUNTER — Other Ambulatory Visit: Payer: Self-pay

## 2020-07-17 DIAGNOSIS — Z1231 Encounter for screening mammogram for malignant neoplasm of breast: Secondary | ICD-10-CM | POA: Insufficient documentation

## 2020-07-21 DIAGNOSIS — Z23 Encounter for immunization: Secondary | ICD-10-CM | POA: Diagnosis not present

## 2020-08-21 ENCOUNTER — Ambulatory Visit (HOSPITAL_COMMUNITY)
Admission: RE | Admit: 2020-08-21 | Discharge: 2020-08-21 | Disposition: A | Discharge status: Home / Self Care | Payer: Medicare Other | Source: Ambulatory Visit | Attending: Internal Medicine | Admitting: Internal Medicine

## 2020-08-21 ENCOUNTER — Other Ambulatory Visit: Payer: Self-pay

## 2020-08-21 DIAGNOSIS — Z1231 Encounter for screening mammogram for malignant neoplasm of breast: Secondary | ICD-10-CM | POA: Diagnosis not present

## 2020-09-21 DIAGNOSIS — N183 Chronic kidney disease, stage 3 unspecified: Secondary | ICD-10-CM | POA: Diagnosis not present

## 2020-09-21 DIAGNOSIS — E1129 Type 2 diabetes mellitus with other diabetic kidney complication: Secondary | ICD-10-CM | POA: Diagnosis not present

## 2020-09-21 DIAGNOSIS — Z79899 Other long term (current) drug therapy: Secondary | ICD-10-CM | POA: Diagnosis not present

## 2020-09-21 DIAGNOSIS — I10 Essential (primary) hypertension: Secondary | ICD-10-CM | POA: Diagnosis not present

## 2020-09-21 DIAGNOSIS — K219 Gastro-esophageal reflux disease without esophagitis: Secondary | ICD-10-CM | POA: Diagnosis not present

## 2020-09-29 DIAGNOSIS — E785 Hyperlipidemia, unspecified: Secondary | ICD-10-CM | POA: Diagnosis not present

## 2020-09-29 DIAGNOSIS — N183 Chronic kidney disease, stage 3 unspecified: Secondary | ICD-10-CM | POA: Diagnosis not present

## 2020-09-29 DIAGNOSIS — Z0000 Encounter for general adult medical examination without abnormal findings: Secondary | ICD-10-CM | POA: Diagnosis not present

## 2020-09-29 DIAGNOSIS — Z0001 Encounter for general adult medical examination with abnormal findings: Secondary | ICD-10-CM | POA: Diagnosis not present

## 2020-09-29 DIAGNOSIS — E1121 Type 2 diabetes mellitus with diabetic nephropathy: Secondary | ICD-10-CM | POA: Diagnosis not present

## 2020-10-05 NOTE — Progress Notes (Signed)
Follow-up Note: Gyn-Onc  Consult was requested by Dr. Nori Riis for the evaluation of SAMERIA CONTRERAS 74 y.o. female  CC:  Chief Complaint  Patient presents with  . Endometrial cancer [facility])   Assessment/Plan:  Ms. KASI REDDICKS  is a 74 y.o.  year old with stage IA grade 1 endometrioid endometrial cancer s/p robotic assisted hysterectomy, BSO and staging on 09/06/16.  Pathology revealed low risk factors for recurrence, therefore no adjuvant therapy is recommended according to NCCN guidelines.  I discussed risk for recurrence and typical symptoms encouraged her to notify us of these should they develop between visits.  I recommend she continue follow-up every 6 months for 5 years in accordance with NCCN guidelines. Those visits should include symptom assessment, physical exam and pelvic examination. Pap smears are not indicated or recommended in the routine surveillance of endometrial cancer.  She will follow-up with me in 6 months after which she will graduate from cancer surveillance as she is 5 years out.   HPI: Jennifer Morrison is a 73 year old G2P2 who is seen in consultation at the request of Dr Nori Riis for endometrial cancer.    The patient reports a single episode of postmenopausal bleeding in September, 2017. Dr Nori Riis performed an [place] on 07/14/16 which showed a 5.6x3.1x3.8cm uterus with a 1.4cm endometrial stripe (no adnexal masses). A hysteroscopy and D&C was performed on 08/03/16 which revealed endometrial adenocarcinoma associated with CAH (FIGO grade 1).  The patient is obese and has DM (HbA1C 6%) on metformin. The patient has HTN.  The patient underwent robotic assisted total hysterectomy, BSO and SLN biopsy on 09/06/16. Surgery was uncomplicated. Final pathology revealed a grade 1 cancer with no myometrial invasion. It measured 2.6cm. No LVSI, cervical involvement and adnexal or nodal involvement was seen. In accordance with NCCN guidelines no adjuvant therapy was recommended.  When she  was seen in June 2020 she reported abdominal pains which prompted ordering of a CT scan of the abdomen and pelvis which was performed on April 09, 2019.  This revealed no acute findings within the abdomen and pelvis including no evidence of recurrent or metastatic disease.  There is mild colonic diverticulosis seen without diverticulitis.  Interval Hx:  Since then she has done well with no specific concerning symptoms for recurrence.   Current Meds:  Outpatient Encounter Medications as of 10/07/2020  Medication Sig  . ACCU-CHEK AVIVA PLUS test strip 1 each by Other route daily.   Marland Kitchen ACCU-CHEK FASTCLIX LANCETS MISC 1 each by Other route daily.   Marland Kitchen amLODipine (NORVASC) 5 MG tablet Take 5 mg by mouth daily.  Marland Kitchen aspirin EC 81 MG tablet Take 81 mg by mouth every evening.  . clobetasol cream (TEMOVATE) [id number] % Apply 1 application topically 2 (two) times daily as needed (eczema).  . metFORMIN (GLUCOPHAGE) 500 MG tablet Take 500 mg by mouth 2 (two) times daily.   Marland Kitchen omeprazole (PRILOSEC) 20 MG capsule Take 20 mg by mouth daily.  . pravastatin (PRAVACHOL) 20 MG tablet Take 20 mg by mouth every evening.   . valsartan-hydrochlorothiazide (DIOVAN-HCT) 160-25 MG tablet Take 1 tablet by mouth daily.  . [DISCONTINUED] NON FORMULARY Place 1 drop into the left eye 3 (three) times daily. Pred-Gati-Brom (Prednisolone Acetate/Gatifloxacin/Bromfenac)  . [DISCONTINUED] PRESCRIPTION MEDICATION Place 1 drop into the right eye 3 (three) times daily. Pred-Brom (Prednisolone Phosphate/Bromfenac)   No facility-administered encounter medications on file as of 10/07/2020.    Allergy: No Known Allergies  Social Hx:   Social  History   Socioeconomic History  . Marital status: Married    Spouse name: Broadus John  . Number of children: Not on file  . Years of education: Not on file  . Highest education level: Not on file  Occupational History  . Not on file  Tobacco Use  . Smoking status: Never Smoker  . Smokeless  tobacco: Never Used  Vaping Use  . Vaping Use: Never used  Substance and Sexual Activity  . Alcohol use: No  . Drug use: No  . Sexual activity: Yes  Other Topics Concern  . Not on file  Social History Narrative  . Not on file   Social Determinants of Health   Financial Resource Strain: Not on file  Food Insecurity: Not on file  Transportation Needs: Not on file  Physical Activity: Not on file  Stress: Not on file  Social Connections: Not on file  Intimate Partner Violence: Not on file    Past Surgical Hx:  Past Surgical History:  Procedure Laterality Date  . ABDOMINAL HYSTERECTOMY    . CATARACT EXTRACTION W/PHACO Right 06/04/2018   Procedure: CATARACT EXTRACTION PHACO AND INTRAOCULAR LENS PLACEMENT RIGHT EYE CDE=7.64;  Surgeon: Tonny Branch, MD;  Location: [facility];  Service: Ophthalmology;  Laterality: Right;  right  . CATARACT EXTRACTION W/PHACO Left 07/09/2018   Procedure: CATARACT EXTRACTION PHACO AND INTRAOCULAR LENS PLACEMENT (IOC);  Surgeon: Tonny Branch, MD;  Location: [facility];  Service: Ophthalmology;  Laterality: Left;  CDE: 5.53  . COLONOSCOPY  2005  . COLONOSCOPY N/A 11/28/2013   Procedure: COLONOSCOPY;  Surgeon: Rogene Houston, MD;  [facility];  Service: Endoscopy;  Laterality: N/A;  830  . Polyp removed from uterus    . ROBOTIC ASSISTED LAP VAGINAL HYSTERECTOMY N/A 09/06/2016   Procedure: XI ROBOTIC ASSISTED LAPAROSCOPIC TOTAL HYSTERECTOMY;  Surgeon: Everitt Amber, MD;  [facility]: [facility];  Service: Gynecology;  Laterality: N/A;  . ROBOTIC ASSISTED SALPINGO OOPHERECTOMY Bilateral 09/06/2016   Procedure: XI ROBOTIC ASSISTED SALPINGO OOPHORECTOMY;  Surgeon: Everitt Amber, MD;  [facility]: [facility];  Service: Gynecology;  Laterality: Bilateral;  . SENTINEL NODE BIOPSY N/A 09/06/2016   Procedure: SENTINEL NODE BIOPSY;  Surgeon: Everitt Amber, MD;  [facility];  Service: Gynecology;  Laterality: N/A;    Past Medical Hx:  Past Medical History:  Diagnosis Date  .  Actinic keratosis 08/23/2018   L lat calf - bx proven  . Basal cell carcinoma 09/12/2019   R nasal facial angle near eyelid  . Diabetes mellitus without complication (HCC)    NIDDM x 1 yr  . GERD (gastroesophageal reflux disease)   . Hypercholesteremia   . Hypertension   . Uterine cancer ([place]) 08/14/2016    Past Gynecological History:  G2P2 SVD x 2 No LMP recorded. Patient has had a hysterectomy.  Family Hx:  Family History  Problem Relation Age of Onset  . Diabetes Father   . Diabetes Brother   . Colon cancer Neg Hx     Review of Systems:  Constitutional  Feels well,    ENT Normal appearing ears and nares bilaterally Skin/Breast  No rash, sores, jaundice, itching, dryness Cardiovascular  No chest pain, shortness of breath, or edema  Pulmonary  No cough or wheeze.  Gastro Intestinal  No nausea, vomitting, or diarrhoea. No bright red blood per rectum, change in bowel movement, or constipation.  + left upper quadrant pains  Genito Urinary  No frequency, urgency, dysuria, no postmenopausal bleeding Musculo Skeletal  No myalgia,  arthralgia, joint swelling or pain  Neurologic  No weakness, numbness, change in gait,  Psychology  No depression, anxiety, insomnia.   Vitals:  Blood pressure (!) 164/73, pulse 66, temperature 97.9 F (36.6 C), temperature source Tympanic, resp. rate 17, height 5\' 2"  (1.575 m), weight 180 lb 9.6 oz (81.9 kg), SpO2 100 %.  Physical Exam: WD in NAD Neck  Supple NROM, without any enlargements.  Lymph Node Survey No cervical supraclavicular or inguinal adenopathy Cardiovascular  Pulse normal rate, regularity and rhythm. S1 and S2 normal.  Lungs  Clear to auscultation bilateraly, without wheezes/crackles/rhonchi. Good air movement.  Skin  No rash/lesions/breakdown  Psychiatry  Alert and oriented to person, place, and time  Abdomen  Normoactive bowel sounds, abdomen soft, non-tender and obese without evidence of hernia. Well healed  incisions.  Back No CVA tenderness Genito Urinary: surgically absent uterus cervix. Vaginal cuff smooth and free of masses and blood. No lesions. Rectal  deferred Extremities  No bilateral cyanosis, clubbing or edema.   Thereasa Solo, MD  10/07/2020, 3:00 PM

## 2020-10-06 ENCOUNTER — Encounter: Payer: Self-pay | Admitting: Gynecologic Oncology

## 2020-10-07 ENCOUNTER — Other Ambulatory Visit: Payer: Self-pay

## 2020-10-07 ENCOUNTER — Inpatient Hospital Stay: Payer: Medicare Other | Attending: Gynecologic Oncology | Admitting: Gynecologic Oncology

## 2020-10-07 ENCOUNTER — Encounter: Payer: Self-pay | Admitting: Gynecologic Oncology

## 2020-10-07 VITALS — BP 164/73 | HR 66 | Temp 97.9°F | Resp 17 | Ht 62.0 in | Wt 180.6 lb

## 2020-10-07 DIAGNOSIS — Z90710 Acquired absence of both cervix and uterus: Secondary | ICD-10-CM | POA: Insufficient documentation

## 2020-10-07 DIAGNOSIS — C541 Malignant neoplasm of endometrium: Secondary | ICD-10-CM | POA: Insufficient documentation

## 2020-10-07 DIAGNOSIS — Z90722 Acquired absence of ovaries, bilateral: Secondary | ICD-10-CM | POA: Insufficient documentation

## 2020-10-07 NOTE — Patient Instructions (Signed)
Please notify Dr Caiden Arteaga at phone number [phone] if you notice vaginal bleeding, new pelvic or abdominal pains, bloating, feeling full easy, or a change in bladder or bowel function.   Please return to see Dr Minette Manders in 6 months as scheduled.  

## 2020-11-02 DIAGNOSIS — E119 Type 2 diabetes mellitus without complications: Secondary | ICD-10-CM | POA: Diagnosis not present

## 2020-11-11 ENCOUNTER — Other Ambulatory Visit: Payer: Self-pay | Admitting: Dermatology

## 2020-11-11 ENCOUNTER — Other Ambulatory Visit: Payer: Self-pay

## 2020-11-11 ENCOUNTER — Ambulatory Visit (INDEPENDENT_AMBULATORY_CARE_PROVIDER_SITE_OTHER): Payer: Medicare Other | Admitting: Dermatology

## 2020-11-11 DIAGNOSIS — Z85828 Personal history of other malignant neoplasm of skin: Secondary | ICD-10-CM | POA: Diagnosis not present

## 2020-11-11 DIAGNOSIS — Z1283 Encounter for screening for malignant neoplasm of skin: Secondary | ICD-10-CM | POA: Diagnosis not present

## 2020-11-11 DIAGNOSIS — L820 Inflamed seborrheic keratosis: Secondary | ICD-10-CM

## 2020-11-11 DIAGNOSIS — D489 Neoplasm of uncertain behavior, unspecified: Secondary | ICD-10-CM

## 2020-11-11 DIAGNOSIS — Z872 Personal history of diseases of the skin and subcutaneous tissue: Secondary | ICD-10-CM

## 2020-11-11 DIAGNOSIS — L219 Seborrheic dermatitis, unspecified: Secondary | ICD-10-CM

## 2020-11-11 DIAGNOSIS — L570 Actinic keratosis: Secondary | ICD-10-CM | POA: Diagnosis not present

## 2020-11-11 DIAGNOSIS — D229 Melanocytic nevi, unspecified: Secondary | ICD-10-CM

## 2020-11-11 DIAGNOSIS — D485 Neoplasm of uncertain behavior of skin: Secondary | ICD-10-CM

## 2020-11-11 DIAGNOSIS — L578 Other skin changes due to chronic exposure to nonionizing radiation: Secondary | ICD-10-CM

## 2020-11-11 DIAGNOSIS — L853 Xerosis cutis: Secondary | ICD-10-CM | POA: Diagnosis not present

## 2020-11-11 DIAGNOSIS — D1800 Hemangioma unspecified site: Secondary | ICD-10-CM

## 2020-11-11 DIAGNOSIS — L814 Other melanin hyperpigmentation: Secondary | ICD-10-CM

## 2020-11-11 DIAGNOSIS — L430 Hypertrophic lichen planus: Secondary | ICD-10-CM | POA: Diagnosis not present

## 2020-11-11 DIAGNOSIS — L821 Other seborrheic keratosis: Secondary | ICD-10-CM | POA: Diagnosis not present

## 2020-11-11 MED ORDER — MUPIROCIN 2 % EX OINT: 1.0000 "application " | TOPICAL_OINTMENT | Freq: Two times a day (BID) | CUTANEOUS | 0 refills | Status: DC

## 2020-11-11 MED ORDER — KETOCONAZOLE 2 % EX SHAM: 1.0000 "application " | MEDICATED_SHAMPOO | CUTANEOUS | 11 refills | Status: DC

## 2020-11-11 NOTE — Patient Instructions (Addendum)
Melanoma ABCDEs  Melanoma is the most dangerous type of skin cancer, and is the leading cause of death from skin disease.  You are more likely to develop melanoma if you:  Have light-colored skin, light-colored eyes, or red or blond hair  Spend a lot of time in the sun  Tan regularly, either outdoors or in a tanning bed  Have had blistering sunburns, especially during childhood  Have a close family member who has had a melanoma  Have atypical moles or large birthmarks  Early detection of melanoma is key since treatment is typically straightforward and cure rates are extremely high if we catch it early.   The first sign of melanoma is often a change in a mole or a new dark spot.  The ABCDE system is a way of remembering the signs of melanoma.  A for asymmetry:  The two halves do not match. B for border:  The edges of the growth are irregular. C for color:  A mixture of colors are present instead of an even brown color. D for diameter:  Melanomas are usually (but not always) greater than 41m - the size of a pencil eraser. E for evolution:  The spot keeps changing in size, shape, and color.  Please check your skin once per month between visits. You can use a small mirror in front and a large mirror behind you to keep an eye on the back side or your body.   If you see any new or changing lesions before your next follow-up, please call to schedule a visit.  Please continue daily skin protection including broad spectrum sunscreen SPF 30+ to sun-exposed areas, reapplying every 2 hours as needed when you're outdoors.   Biopsy Wound Care Instructions  1. Leave the original bandage on for 24 hours if possible.  If the bandage becomes soaked or soiled before that time, it is OK to remove it and examine the wound.  A small amount of post-operative bleeding is normal.  If excessive bleeding occurs, remove the bandage, place gauze over the site and apply continuous pressure (no peeking) over the  area for 30 minutes. If this does not work, please call our clinic as soon as possible or page your doctor if it is after hours.   2. Once a day, cleanse the wound with soap and water. It is fine to shower. If a thick crust develops you may use a Q-tip dipped into dilute hydrogen peroxide (mix 1:1 with water) to dissolve it.  Hydrogen peroxide can slow the healing process, so use it only as needed.    3. After washing, apply petroleum jelly (Vaseline) or an antibiotic ointment if your doctor prescribed one for you, followed by a bandage.    4. For best healing, the wound should be covered with a layer of ointment at all times. If you are not able to keep the area covered with a bandage to hold the ointment in place, this may mean re-applying the ointment several times a day.  Continue this wound care until the wound has healed and is no longer open.   Itching and mild discomfort is normal during the healing process. However, if you develop pain or severe itching, please call our office.   If you have any discomfort, you can take Tylenol (acetaminophen) or ibuprofen as directed on the bottle. (Please do not take these if you have an allergy to them or cannot take them for another reason).  Some redness, tenderness and white  or yellow material in the wound is normal healing.  If the area becomes very sore and red, or develops a thick yellow-green material (pus), it may be infected; please notify us.    If you have stitches, return to clinic as directed to have the stitches removed. You will continue wound care for 2-3 days after the stitches are removed.   Wound healing continues for up to one year following surgery. It is not unusual to experience pain in the scar from time to time during the interval.  If the pain becomes severe or the scar thickens, you should notify the office.    A slight amount of redness in a scar is expected for the first six months.  After six months, the redness will fade  and the scar will soften and fade.  The color difference becomes less noticeable with time.  If there are any problems, return for a post-op surgery check at your earliest convenience.  To improve the appearance of the scar, you can use silicone scar gel, cream, or sheets (such as Mederma or Serica) every night for up to one year. These are available over the counter (without a prescription).  Please call our office at [phone] for any questions or concerns.   Cryotherapy Aftercare  . Wash gently with soap and water everyday.   . Apply Vaseline and Band-Aid daily until healed.  

## 2020-11-11 NOTE — Progress Notes (Signed)
Follow-Up Visit   Subjective  Jennifer Morrison is a 75 y.o. female who presents for the following: 6 month follow up (Patient here today for follow up for place on right lower leg. She denies noticing any changes. She has history of bcc on right nasal facial angle near eyelid and has had mohs surgery in past.  - she reports no change. She has history of eczema on left foot and states she needs refill on clobetasol and ammoinium lactate cream. She states that cream helped for a day or 2 but would just come back. She denies other issues at this time. ).  Patient here for full body skin exam and skin cancer screening.  The following portions of the chart were reviewed this encounter and updated as appropriate:  Tobacco  Allergies  Meds  Problems  Med Hx  Surg Hx  Fam Hx        Objective  Well appearing patient in no apparent distress; mood and affect are within normal limits.  A full examination was performed including scalp, head, eyes, ears, nose, lips, neck, chest, axillae, abdomen, back, buttocks, bilateral upper extremities, bilateral lower extremities, hands, feet, fingers, toes, fingernails, and toenails. All findings within normal limits unless otherwise noted below.  Objective  right lower leg: 1.2 cm scaly pink plaque      Objective  Scalp: Scaly plaques with greasy scale  Objective  Right Lower Back: Erythematous thin papules/macules with gritty scale.   Objective  left forearm ventral x 1: Erythematous keratotic or waxy stuck-on papule or plaque.   Assessment & Plan      Assessment & Plan  Neoplasm of uncertain behavior right lower leg  Skin / nail biopsy Type of biopsy: tangential   Informed consent: discussed and consent obtained   Timeout: patient name, date of birth, surgical site, and procedure verified   Patient was prepped and draped in usual sterile fashion: Area prepped with isopropyl alcohol. Anesthesia: the lesion was anesthetized in a  standard fashion   Anesthetic:  1% lidocaine w/ epinephrine 1-100,000 buffered w/ 8.4% NaHCO3 Instrument used: flexible razor blade   Hemostasis achieved with: aluminum chloride   Outcome: patient tolerated procedure well   Post-procedure details: wound care instructions given   Additional details:  Mupirocin and a bandage applied  Specimen 1 - Surgical pathology Differential Diagnosis: r/o sccis  Check Margins: No 1.2 cm scaly pink plaque Right lower leg  R/o sccis  Ordered Medications: mupirocin ointment (BACTROBAN) 2 %  Seborrheic dermatitis Scalp  Chronic condition with duration or expected duration over one year. Condition is bothersome to patient. Currently flared.  Patient using otc head and shoulders also using tea tree oil  Start ketoconazole shampoo apply three times per week, massage into scalp and leave in for 10 minutes before rinsing out   Ordered Medications: ketoconazole (NIZORAL) 2 % shampoo  Xerosis cutis Left Lower Leg - Anterior  Gentle skin care reviewed  AK (actinic keratosis) Right Lower Back  Prior to procedure, discussed risks of blister formation, small wound, skin dyspigmentation, or rare scar following cryotherapy.    Destruction of lesion - Right Lower Back  Destruction method: cryotherapy   Informed consent: discussed and consent obtained   Lesion destroyed using liquid nitrogen: Yes   Cryotherapy cycles:  2 Outcome: patient tolerated procedure well with no complications   Post-procedure details: wound care instructions given    Inflamed seborrheic keratosis left forearm ventral x 1  Prior to procedure, discussed risks  of blister formation, small wound, skin dyspigmentation, or rare scar following cryotherapy.    Destruction of lesion - left forearm ventral x 1  Destruction method: cryotherapy   Informed consent: discussed and consent obtained   Lesion destroyed using liquid nitrogen: Yes   Cryotherapy cycles:  2 Outcome:  patient tolerated procedure well with no complications   Post-procedure details: wound care instructions given    Hemangiomas - Red papules - Discussed benign nature - Observe - Call for any changes  Lentigines - Scattered tan macules - Discussed due to sun exposure - Benign, observe - Call for any changes  Seborrheic Keratoses - Stuck-on, waxy, tan-brown papules and plaques  - Discussed benign etiology and prognosis. - Observe - Call for any changes  Melanocytic Nevi - Tan-brown and/or pink-flesh-colored symmetric macules and papules - Benign appearing on exam today - Observation - Call clinic for new or changing moles - Recommend daily use of broad spectrum spf 30+ sunscreen to sun-exposed areas.   Hemangiomas - Red papules - Discussed benign nature - Observe - Call for any changes  Actinic Damage - Chronic, secondary to cumulative UV/sun exposure - diffuse scaly erythematous macules with underlying dyspigmentation - Recommend daily broad spectrum sunscreen SPF 30+ to sun-exposed areas, reapply every 2 hours as needed.  - Call for new or changing lesions.  Skin cancer screening performed today.  History of Basal Cell Carcinoma of the Skin Right nasal facial angle near eyelid - clear  - No evidence of recurrence today - Recommend regular full body skin exams - Recommend daily broad spectrum sunscreen SPF 30+ to sun-exposed areas, reapply every 2 hours as needed.  - Call if any new or changing lesions are noted between office visits  History of PreCancerous Actinic Keratosis  - site(s) of PreCancerous Actinic Keratosis clear today. - these may recur and new lesions may form requiring treatment to prevent transformation into skin cancer - observe for new or changing spots and contact [place] for appointment if occur - photoprotection with sun protective clothing; sunglasses and broad spectrum sunscreen with SPF of at least 30 + and frequent self skin  exams recommended - yearly exams by a dermatologist recommended for persons with history of PreCancerous Actinic Keratoses   Return in about 6 weeks (around 12/23/2020) for ak followup, spot on right lower leg, isk f/u.  I, Jennifer Morrison, CMA, am acting as scribe for [name], MD.   Documentation: I have reviewed the above documentation for accuracy and completeness, and I agree with the above.  [name], MD

## 2020-12-07 ENCOUNTER — Encounter: Payer: Self-pay | Admitting: Dermatology

## 2020-12-30 ENCOUNTER — Ambulatory Visit: Payer: Medicare Other | Admitting: Dermatology

## 2021-01-11 DIAGNOSIS — H353121 Nonexudative age-related macular degeneration, left eye, early dry stage: Secondary | ICD-10-CM | POA: Diagnosis not present

## 2021-01-11 DIAGNOSIS — H26491 Other secondary cataract, right eye: Secondary | ICD-10-CM | POA: Diagnosis not present

## 2021-01-11 DIAGNOSIS — H01004 Unspecified blepharitis left upper eyelid: Secondary | ICD-10-CM | POA: Diagnosis not present

## 2021-01-11 DIAGNOSIS — H01001 Unspecified blepharitis right upper eyelid: Secondary | ICD-10-CM | POA: Diagnosis not present

## 2021-01-11 DIAGNOSIS — H01002 Unspecified blepharitis right lower eyelid: Secondary | ICD-10-CM | POA: Diagnosis not present

## 2021-01-11 DIAGNOSIS — H01005 Unspecified blepharitis left lower eyelid: Secondary | ICD-10-CM | POA: Diagnosis not present

## 2021-01-11 DIAGNOSIS — Z961 Presence of intraocular lens: Secondary | ICD-10-CM | POA: Diagnosis not present

## 2021-01-21 DIAGNOSIS — K760 Fatty (change of) liver, not elsewhere classified: Secondary | ICD-10-CM | POA: Diagnosis not present

## 2021-01-21 DIAGNOSIS — I10 Essential (primary) hypertension: Secondary | ICD-10-CM | POA: Diagnosis not present

## 2021-01-21 DIAGNOSIS — E785 Hyperlipidemia, unspecified: Secondary | ICD-10-CM | POA: Diagnosis not present

## 2021-01-21 DIAGNOSIS — E559 Vitamin D deficiency, unspecified: Secondary | ICD-10-CM | POA: Diagnosis not present

## 2021-01-21 DIAGNOSIS — N183 Chronic kidney disease, stage 3 unspecified: Secondary | ICD-10-CM | POA: Diagnosis not present

## 2021-01-21 DIAGNOSIS — E1129 Type 2 diabetes mellitus with other diabetic kidney complication: Secondary | ICD-10-CM | POA: Diagnosis not present

## 2021-01-21 DIAGNOSIS — Z79899 Other long term (current) drug therapy: Secondary | ICD-10-CM | POA: Diagnosis not present

## 2021-01-26 DIAGNOSIS — I10 Essential (primary) hypertension: Secondary | ICD-10-CM | POA: Diagnosis not present

## 2021-01-26 DIAGNOSIS — R7309 Other abnormal glucose: Secondary | ICD-10-CM | POA: Diagnosis not present

## 2021-01-26 DIAGNOSIS — E1122 Type 2 diabetes mellitus with diabetic chronic kidney disease: Secondary | ICD-10-CM | POA: Diagnosis not present

## 2021-01-26 DIAGNOSIS — E559 Vitamin D deficiency, unspecified: Secondary | ICD-10-CM | POA: Diagnosis not present

## 2021-01-26 DIAGNOSIS — N1832 Chronic kidney disease, stage 3b: Secondary | ICD-10-CM | POA: Diagnosis not present

## 2021-01-29 DIAGNOSIS — Z23 Encounter for immunization: Secondary | ICD-10-CM | POA: Diagnosis not present

## 2021-03-30 ENCOUNTER — Inpatient Hospital Stay: Payer: Medicare Other | Attending: Gynecologic Oncology | Admitting: Gynecologic Oncology

## 2021-03-30 ENCOUNTER — Encounter: Payer: Self-pay | Admitting: Gynecologic Oncology

## 2021-03-30 ENCOUNTER — Other Ambulatory Visit: Payer: Self-pay

## 2021-03-30 VITALS — BP 153/67 | HR 69 | Temp 97.0°F | Resp 16 | Ht 62.0 in | Wt 186.7 lb

## 2021-03-30 DIAGNOSIS — I10 Essential (primary) hypertension: Secondary | ICD-10-CM | POA: Diagnosis not present

## 2021-03-30 DIAGNOSIS — Z7982 Long term (current) use of aspirin: Secondary | ICD-10-CM | POA: Diagnosis not present

## 2021-03-30 DIAGNOSIS — E7800 Pure hypercholesterolemia, unspecified: Secondary | ICD-10-CM | POA: Diagnosis not present

## 2021-03-30 DIAGNOSIS — K219 Gastro-esophageal reflux disease without esophagitis: Secondary | ICD-10-CM | POA: Diagnosis not present

## 2021-03-30 DIAGNOSIS — Z90722 Acquired absence of ovaries, bilateral: Secondary | ICD-10-CM | POA: Insufficient documentation

## 2021-03-30 DIAGNOSIS — C541 Malignant neoplasm of endometrium: Secondary | ICD-10-CM

## 2021-03-30 DIAGNOSIS — Z8542 Personal history of malignant neoplasm of other parts of uterus: Secondary | ICD-10-CM | POA: Diagnosis present

## 2021-03-30 DIAGNOSIS — Z90710 Acquired absence of both cervix and uterus: Secondary | ICD-10-CM | POA: Insufficient documentation

## 2021-03-30 DIAGNOSIS — Z79899 Other long term (current) drug therapy: Secondary | ICD-10-CM | POA: Insufficient documentation

## 2021-03-30 DIAGNOSIS — E119 Type 2 diabetes mellitus without complications: Secondary | ICD-10-CM | POA: Insufficient documentation

## 2021-03-30 NOTE — Progress Notes (Signed)
Follow-up Note: Gyn-Onc  Consult was requested by Dr. Nori Riis for the evaluation of Jennifer Morrison 75 y.o. female  CC:  Chief Complaint  Patient presents with   Endometrial cancer [facility])   Assessment/Plan:  Jennifer Morrison  is a 75 y.o.  year old with stage IA grade 1 endometrioid endometrial cancer s/p robotic assisted hysterectomy, BSO and staging on 09/06/16.  Pathology revealed low risk factors for recurrence, therefore no adjuvant therapy is recommended according to NCCN guidelines.  She will graduate from cancer surveillance as she is 5 years out with no evidence of disease. I recommend that she return to the cancer center on a prn basis with new concerns should they arise.   HPI: Jennifer Morrison is a 75 year old G2P2 who is seen in consultation at the request of Dr Nori Riis for endometrial cancer.    The patient reported a single episode of postmenopausal bleeding in September, 2017. Dr Nori Riis performed an [place] on 07/14/16 which showed a 5.6x3.1x3.8cm uterus with a 1.4cm endometrial stripe (no adnexal masses). A hysteroscopy and D&C was performed on 08/03/16 which revealed endometrial adenocarcinoma associated with CAH (FIGO grade 1).  The patient underwent robotic assisted total hysterectomy, BSO and SLN biopsy on 09/06/16. Surgery was uncomplicated. Final pathology revealed a grade 1 cancer with no myometrial invasion. It measured 2.6cm. No LVSI, cervical involvement and adnexal or nodal involvement was seen. In accordance with NCCN guidelines no adjuvant therapy was recommended.  When she was seen in June 2020 she reported abdominal pains which prompted ordering of a CT scan of the abdomen and pelvis which was performed on April 09, 2019.  This revealed no acute findings within the abdomen and pelvis including no evidence of recurrent or metastatic disease.  There is mild colonic diverticulosis seen without diverticulitis.  Interval Hx:  Since then she has done well with no specific concerning  symptoms for recurrence.   Current Meds:  Outpatient Encounter Medications as of 03/30/2021  Medication Sig   ACCU-CHEK AVIVA PLUS test strip 1 each by Other route daily.    ACCU-CHEK FASTCLIX LANCETS MISC 1 each by Other route daily.    amLODipine (NORVASC) 5 MG tablet Take 5 mg by mouth daily.   aspirin EC 81 MG tablet Take 81 mg by mouth every evening.   clobetasol cream (TEMOVATE) 1.61 % Apply 1 application topically 2 (two) times daily as needed (eczema).   ketoconazole (NIZORAL) 2 % shampoo Apply 1 application topically See admin instructions. apply three times per week, massage into scalp and leave in for 10 minutes before rinsing out   metFORMIN (GLUCOPHAGE) 500 MG tablet Take 500 mg by mouth 2 (two) times daily.    mupirocin ointment (BACTROBAN) 2 % Apply 1 application topically 2 (two) times daily. Apply to wound until healed   omeprazole (PRILOSEC) 20 MG capsule Take 20 mg by mouth daily.   pravastatin (PRAVACHOL) 20 MG tablet Take 20 mg by mouth every evening.    valsartan-hydrochlorothiazide (DIOVAN-HCT) 160-25 MG tablet Take 1 tablet by mouth daily.   No facility-administered encounter medications on file as of 03/30/2021.    Allergy: No Known Allergies  Social Hx:   Social History   Socioeconomic History   Marital status: Married    Spouse name: Broadus John   Number of children: Not on file   Years of education: Not on file   Highest education level: Not on file  Occupational History   Not on file  Tobacco Use  Smoking status: Never   Smokeless tobacco: Never  Vaping Use   Vaping Use: Never used  Substance and Sexual Activity   Alcohol use: No   Drug use: No   Sexual activity: Yes  Other Topics Concern   Not on file  Social History Narrative   Not on file   Social Determinants of Health   Financial Resource Strain: Not on file  Food Insecurity: Not on file  Transportation Needs: Not on file  Physical Activity: Not on file  Stress: Not on file  Social  Connections: Not on file  Intimate Partner Violence: Not on file    Past Surgical Hx:  Past Surgical History:  Procedure Laterality Date   ABDOMINAL HYSTERECTOMY     CATARACT EXTRACTION W/PHACO Right 06/04/2018   Procedure: CATARACT EXTRACTION PHACO AND INTRAOCULAR LENS PLACEMENT RIGHT EYE CDE=7.64;  Surgeon: Tonny Branch, MD;  Location: [facility];  Service: Ophthalmology;  Laterality: Right;  right   CATARACT EXTRACTION W/PHACO Left 07/09/2018   Procedure: CATARACT EXTRACTION PHACO AND INTRAOCULAR LENS PLACEMENT (IOC);  Surgeon: Tonny Branch, MD;  Location: [facility];  Service: Ophthalmology;  Laterality: Left;  CDE: 5.53   COLONOSCOPY  2005   COLONOSCOPY N/A 11/28/2013   Procedure: COLONOSCOPY;  Surgeon: Rogene Houston, MD;  Location: [facility];  Service: Endoscopy;  Laterality: N/A;  830   Polyp removed from uterus     ROBOTIC ASSISTED LAP VAGINAL HYSTERECTOMY N/A 09/06/2016   Procedure: XI ROBOTIC ASSISTED LAPAROSCOPIC TOTAL HYSTERECTOMY;  Surgeon: Everitt Amber, MD;  Location: [facility];  Service: Gynecology;  Laterality: N/A;   ROBOTIC ASSISTED SALPINGO OOPHERECTOMY Bilateral 09/06/2016   Procedure: XI ROBOTIC ASSISTED SALPINGO OOPHORECTOMY;  Surgeon: Everitt Amber, MD;  Location: [facility];  Service: Gynecology;  Laterality: Bilateral;   SENTINEL NODE BIOPSY N/A 09/06/2016   Procedure: SENTINEL NODE BIOPSY;  Surgeon: Everitt Amber, MD;  Location: [facility];  Service: Gynecology;  Laterality: N/A;    Past Medical Hx:  Past Medical History:  Diagnosis Date   Actinic keratosis 08/23/2018   L lat calf - bx proven   Basal cell carcinoma 09/12/2019   R nasal facial angle near eyelid   Diabetes mellitus without complication (HCC)    NIDDM x 1 yr   GERD (gastroesophageal reflux disease)    Hypercholesteremia    Hypertension    Uterine cancer ([place]) 08/14/2016    Past Gynecological History:  G2P2 SVD x 2 No LMP recorded. Patient has had a hysterectomy.  Family Hx:  Family History  Problem Relation  Age of Onset   Diabetes Father    Diabetes Brother    Colon cancer Neg Hx     Review of Systems:  Constitutional  Feels well,    ENT Normal appearing ears and nares bilaterally Skin/Breast  No rash, sores, jaundice, itching, dryness Cardiovascular  No chest pain, shortness of breath, or edema  Pulmonary  No cough or wheeze.  Gastro Intestinal  No nausea, vomitting, or diarrhoea. No bright red blood per rectum, change in bowel movement, or constipation.  + left upper quadrant pains  Genito Urinary  No frequency, urgency, dysuria, no postmenopausal bleeding Musculo Skeletal  No myalgia, arthralgia, joint swelling or pain  Neurologic  No weakness, numbness, change in gait,  Psychology  No depression, anxiety, insomnia.   Vitals:  Blood pressure (!) 153/67, pulse 69, temperature (!) 97 F (36.1 C), temperature source Tympanic, resp. rate 16, height 5\' 2"  (1.575 m), weight 186 lb 11.2 oz (84.7 kg),  SpO2 98 %.  Physical Exam: WD in NAD Neck  Supple NROM, without any enlargements.  Lymph Node Survey No cervical supraclavicular or inguinal adenopathy Cardiovascular  Pulse normal rate, regularity and rhythm. S1 and S2 normal.  Lungs  Clear to auscultation bilateraly, without wheezes/crackles/rhonchi. Good air movement.  Skin  No rash/lesions/breakdown  Psychiatry  Alert and oriented to person, place, and time  Abdomen  Normoactive bowel sounds, abdomen soft, non-tender and obese without evidence of hernia. Well healed incisions.  Back No CVA tenderness Genito Urinary: surgically absent uterus cervix. Vaginal cuff smooth and free of masses and blood. No lesions. Rectal  deferred Extremities  No bilateral cyanosis, clubbing or edema.   Thereasa Solo, MD  03/30/2021, 2:28 PM

## 2021-03-30 NOTE — Patient Instructions (Signed)
You have completed 5 years of follow-up after your cancer diagnosis with no signs of cancer.  You no longer requires scheduled surveillance visits, however if you develop new symptoms concerning for cancer, particularly those in the pelvis, particular vaginal bleeding, please call [phone] and request to be seen.

## 2021-04-23 DIAGNOSIS — I10 Essential (primary) hypertension: Secondary | ICD-10-CM | POA: Diagnosis not present

## 2021-04-23 DIAGNOSIS — E559 Vitamin D deficiency, unspecified: Secondary | ICD-10-CM | POA: Diagnosis not present

## 2021-04-23 DIAGNOSIS — Z79899 Other long term (current) drug therapy: Secondary | ICD-10-CM | POA: Diagnosis not present

## 2021-04-23 DIAGNOSIS — N1832 Chronic kidney disease, stage 3b: Secondary | ICD-10-CM | POA: Diagnosis not present

## 2021-04-23 DIAGNOSIS — E1129 Type 2 diabetes mellitus with other diabetic kidney complication: Secondary | ICD-10-CM | POA: Diagnosis not present

## 2021-04-30 DIAGNOSIS — I10 Essential (primary) hypertension: Secondary | ICD-10-CM | POA: Diagnosis not present

## 2021-04-30 DIAGNOSIS — N1832 Chronic kidney disease, stage 3b: Secondary | ICD-10-CM | POA: Diagnosis not present

## 2021-04-30 DIAGNOSIS — E1122 Type 2 diabetes mellitus with diabetic chronic kidney disease: Secondary | ICD-10-CM | POA: Diagnosis not present

## 2021-05-27 ENCOUNTER — Ambulatory Visit (INDEPENDENT_AMBULATORY_CARE_PROVIDER_SITE_OTHER): Payer: Medicare Other | Admitting: Dermatology

## 2021-05-27 ENCOUNTER — Other Ambulatory Visit: Payer: Self-pay

## 2021-05-27 DIAGNOSIS — L570 Actinic keratosis: Secondary | ICD-10-CM | POA: Diagnosis not present

## 2021-05-27 DIAGNOSIS — L814 Other melanin hyperpigmentation: Secondary | ICD-10-CM | POA: Diagnosis not present

## 2021-05-27 DIAGNOSIS — L578 Other skin changes due to chronic exposure to nonionizing radiation: Secondary | ICD-10-CM | POA: Diagnosis not present

## 2021-05-27 DIAGNOSIS — D485 Neoplasm of uncertain behavior of skin: Secondary | ICD-10-CM

## 2021-05-27 DIAGNOSIS — L821 Other seborrheic keratosis: Secondary | ICD-10-CM | POA: Diagnosis not present

## 2021-05-27 DIAGNOSIS — Z1283 Encounter for screening for malignant neoplasm of skin: Secondary | ICD-10-CM | POA: Diagnosis not present

## 2021-05-27 DIAGNOSIS — D1800 Hemangioma unspecified site: Secondary | ICD-10-CM

## 2021-05-27 DIAGNOSIS — Z872 Personal history of diseases of the skin and subcutaneous tissue: Secondary | ICD-10-CM | POA: Diagnosis not present

## 2021-05-27 DIAGNOSIS — Z85828 Personal history of other malignant neoplasm of skin: Secondary | ICD-10-CM | POA: Diagnosis not present

## 2021-05-27 DIAGNOSIS — D229 Melanocytic nevi, unspecified: Secondary | ICD-10-CM

## 2021-05-27 NOTE — Patient Instructions (Addendum)
Wound Care Instructions  Cleanse wound gently with soap and water once a day then pat dry with clean gauze. Apply a thing coat of Petrolatum (petroleum jelly, "Vaseline") over the wound (unless you have an allergy to this). We recommend that you use a new, sterile tube of Vaseline. Do not pick or remove scabs. Do not remove the yellow or white "healing tissue" from the base of the wound.  Cover the wound with fresh, clean, nonstick gauze and secure with paper tape. You may use Band-Aids in place of gauze and tape if the would is small enough, but would recommend trimming much of the tape off as there is often too much. Sometimes Band-Aids can irritate the skin.  You should call the office for your biopsy report after 1 week if you have not already been contacted.  If you experience any problems, such as abnormal amounts of bleeding, swelling, significant bruising, significant pain, or evidence of infection, please call the office immediately.  FOR ADULT SURGERY PATIENTS: If you need something for pain relief you may take 1 extra strength Tylenol (acetaminophen) AND 2 Ibuprofen ('200mg'$  each) together every 4 hours as needed for pain. (do not take these if you are allergic to them or if you have a reason you should not take them.) Typically, you may only need pain medication for 1 to 3 days.   Recommend taking Heliocare sun protection supplement daily in sunny weather for additional sun protection. For maximum protection on the sunniest days, you can take up to 2 capsules of regular Heliocare OR take 1 capsule of Heliocare Ultra. For prolonged exposure (such as a full day in the sun), you can repeat your dose of the supplement 4 hours after your first dose. Heliocare can be purchased at [facility] or at [email or web address].    Melanoma ABCDEs  Melanoma is the most dangerous type of skin cancer, and is the leading cause of death from skin disease.  You are more likely to develop melanoma if  you: Have light-colored skin, light-colored eyes, or red or blond hair Spend a lot of time in the sun Tan regularly, either outdoors or in a tanning bed Have had blistering sunburns, especially during childhood Have a close family member who has had a melanoma Have atypical moles or large birthmarks  Early detection of melanoma is key since treatment is typically straightforward and cure rates are extremely high if we catch it early.   The first sign of melanoma is often a change in a mole or a new dark spot.  The ABCDE system is a way of remembering the signs of melanoma.  A for asymmetry:  The two halves do not match. B for border:  The edges of the growth are irregular. C for color:  A mixture of colors are present instead of an even brown color. D for diameter:  Melanomas are usually (but not always) greater than 82m - the size of a pencil eraser. E for evolution:  The spot keeps changing in size, shape, and color.  Please check your skin once per month between visits. You can use a small mirror in front and a large mirror behind you to keep an eye on the back side or your body.   If you see any new or changing lesions before your next follow-up, please call to schedule a visit.  Please continue daily skin protection including broad spectrum sunscreen SPF 30+ to sun-exposed areas, reapplying every 2 hours as needed when you're  outdoors.    If you have any questions or concerns for your doctor, please call our main line at [phone] and press option 4 to reach your doctor's medical assistant. If no one answers, please leave a voicemail as directed and we will return your call as soon as possible. Messages left after 4 pm will be answered the following business day.   You may also send [place] a message via [place]. We typically respond to MyChart messages within 1-2 business days.  For prescription refills, please ask your pharmacy to contact our office. Our fax number is  [phone].  If you have an urgent issue when the clinic is closed that cannot wait until the next business day, you can page your doctor at the number below.    Please note that while we do our best to be available for urgent issues outside of office hours, we are not available 24/7.   If you have an urgent issue and are unable to reach [place], you may choose to seek medical care at your doctor's office, retail clinic, urgent care center, or emergency room.  If you have a medical emergency, please immediately call 911 or go to the emergency department.  Pager Numbers  - Dr. Nehemiah Massed: [phone]  - Dr. Laurence Ferrari: [phone]  - Dr. Nicole Kindred: [phone]  In the event of inclement weather, please call our main line at [phone] for an update on the status of any delays or closures.  Dermatology Medication Tips: Please keep the boxes that topical medications come in in order to help keep track of the instructions about where and how to use these. Pharmacies typically print the medication instructions only on the boxes and not directly on the medication tubes.   If your medication is too expensive, please contact our office at [phone] option 4 or send [place] a message through [place].   We are unable to tell what your co-pay for medications will be in advance as this is different depending on your insurance coverage. However, we may be able to find a substitute medication at lower cost or fill out paperwork to get insurance to cover a needed medication.   If a prior authorization is required to get your medication covered by your insurance company, please allow [place] 1-2 business days to complete this process.  Drug prices often vary depending on where the prescription is filled and some pharmacies may offer cheaper prices.  The website www.goodrx.com contains coupons for medications through different pharmacies. The prices here do not account for what the cost may be with help from  insurance (it may be cheaper with your insurance), but the website can give you the price if you did not use any insurance.  - You can print the associated coupon and take it with your prescription to the pharmacy.  - You may also stop by our office during regular business hours and pick up a GoodRx coupon card.  - If you need your prescription sent electronically to a different pharmacy, notify our office through [facility] or by phone at [phone] option 4.

## 2021-05-27 NOTE — Progress Notes (Signed)
Follow-Up Visit   Subjective  Jennifer Morrison is a 75 y.o. female who presents for the following: TBSE (Patient here for full body skin exam and skin cancer screening. Patient with hx of BCC and AK. She is not aware of any new or changing spots.).   The following portions of the chart were reviewed this encounter and updated as appropriate:   Tobacco  Allergies  Meds  Problems  Med Hx  Surg Hx  Fam Hx      Review of Systems:  No other skin or systemic complaints except as noted in HPI or Assessment and Plan.  Objective  Well appearing patient in no apparent distress; mood and affect are within normal limits.  A full examination was performed including scalp, head, eyes, ears, nose, lips, neck, chest, axillae, abdomen, back, buttocks, bilateral upper extremities, bilateral lower extremities, hands, feet, fingers, toes, fingernails, and toenails. All findings within normal limits unless otherwise noted below.  Right medial calf 0.9cm pink macule with irregular vascular pattern  R/o BCC vs SCCis vs Amelanotic      Assessment & Plan  Neoplasm of uncertain behavior of skin Right medial calf  Skin / nail biopsy Type of biopsy: tangential   Informed consent: discussed and consent obtained   Timeout: patient name, date of birth, surgical site, and procedure verified   Patient was prepped and draped in usual sterile fashion: Area prepped with isopropyl alcohol. Anesthesia: the lesion was anesthetized in a standard fashion   Anesthetic:  1% lidocaine w/ epinephrine 1-100,000 buffered w/ 8.4% NaHCO3 Instrument used: flexible razor blade   Hemostasis achieved with: aluminum chloride   Outcome: patient tolerated procedure well   Post-procedure details: wound care instructions given   Additional details:  Mupirocin and a bandage applied  Specimen 1 - Surgical pathology Differential Diagnosis: R/o BCC vs SCCis vs Amelanotic  Check Margins: No 0.9cm pink macule with irregular  vascular pattern    Lentigines - Scattered tan macules - Due to sun exposure - Benign-appering, observe - Recommend daily broad spectrum sunscreen SPF 30+ to sun-exposed areas, reapply every 2 hours as needed. - Call for any changes  Seborrheic Keratoses - Stuck-on, waxy, tan-brown papules and/or plaques  - Benign-appearing - Discussed benign etiology and prognosis. - Observe - Call for any changes  Melanocytic Nevi - Tan-brown and/or pink-flesh-colored symmetric macules and papules - Benign appearing on exam today - Observation - Call clinic for new or changing moles - Recommend daily use of broad spectrum spf 30+ sunscreen to sun-exposed areas.   Hemangiomas - Red papules - Discussed benign nature - Observe - Call for any changes  Actinic Damage - Chronic condition, secondary to cumulative UV/sun exposure - diffuse scaly erythematous macules with underlying dyspigmentation - Recommend daily broad spectrum sunscreen SPF 30+ to sun-exposed areas, reapply every 2 hours as needed.  - Staying in the shade or wearing long sleeves, sun glasses (UVA+UVB protection) and wide brim hats (4-inch brim around the entire circumference of the hat) are also recommended for sun protection.  - Call for new or changing lesions.  Skin cancer screening performed today.  History of Basal Cell Carcinoma of the Skin - No evidence of recurrence today - Recommend regular full body skin exams - Recommend daily broad spectrum sunscreen SPF 30+ to sun-exposed areas, reapply every 2 hours as needed.  - Call if any new or changing lesions are noted between office visits  History of PreCancerous Actinic Keratosis  - site(s) of PreCancerous  Actinic Keratosis clear today. - these may recur and new lesions may form requiring treatment to prevent transformation into skin cancer - observe for new or changing spots and contact [facility] for appointment if occur - photoprotection with sun  protective clothing; sunglasses and broad spectrum sunscreen with SPF of at least 30 + and frequent self skin exams recommended - yearly exams by a dermatologist recommended for persons with history of PreCancerous Actinic Keratoses  Return for 6-12 months , TBSE.  Graciella Belton, RMA, am acting as scribe for [name], MD .  Documentation: I have reviewed the above documentation for accuracy and completeness, and I agree with the above.  [name], MD

## 2021-06-05 ENCOUNTER — Encounter: Payer: Self-pay | Admitting: Dermatology

## 2021-06-08 ENCOUNTER — Telehealth: Payer: Self-pay

## 2021-06-08 NOTE — Telephone Encounter (Signed)
Patient advised bx showed AK, appt scheduled to treat with LN2./js

## 2021-06-08 NOTE — Telephone Encounter (Signed)
-----   Message from Alfonso Patten, MD sent at 06/03/2021  5:34 PM EDT ----- Skin , right medial calf LICHENOID ACTINIC KERATOSIS  --> LN2  MAs please call. Thank you!

## 2021-06-24 ENCOUNTER — Encounter: Payer: Self-pay | Admitting: Dermatology

## 2021-06-24 ENCOUNTER — Other Ambulatory Visit: Payer: Self-pay

## 2021-06-24 ENCOUNTER — Ambulatory Visit (INDEPENDENT_AMBULATORY_CARE_PROVIDER_SITE_OTHER): Payer: Medicare Other | Admitting: Dermatology

## 2021-06-24 DIAGNOSIS — L570 Actinic keratosis: Secondary | ICD-10-CM | POA: Diagnosis not present

## 2021-06-24 NOTE — Progress Notes (Signed)
   Follow-Up Visit   Subjective  Jennifer Morrison is a 75 y.o. female who presents for the following: bx proven AK (R med calf x 1 - patient is here today for treatment with LN2).   The following portions of the chart were reviewed this encounter and updated as appropriate:   Tobacco  Allergies  Meds  Problems  Med Hx  Surg Hx  Fam Hx      Review of Systems:  No other skin or systemic complaints except as noted in HPI or Assessment and Plan.  Objective  Well appearing patient in no apparent distress; mood and affect are within normal limits.  A focused examination was performed including the right lower leg. Relevant physical exam findings are noted in the Assessment and Plan.  R med calf x 1 Erythematous thin papules/macules with gritty scale.    Assessment & Plan  AK (actinic keratosis) R med calf x 1  Bx proven -   Prior to procedure, discussed risks of blister formation, small wound, skin dyspigmentation, or rare scar following cryotherapy. Recommend Vaseline ointment to treated areas while healing.   Destruction of lesion - R med calf x 1 Complexity: simple   Destruction method: cryotherapy   Informed consent: discussed and consent obtained   Timeout:  patient name, date of birth, surgical site, and procedure verified Lesion destroyed using liquid nitrogen: Yes   Region frozen until ice ball extended beyond lesion: Yes   Outcome: patient tolerated procedure well with no complications   Post-procedure details: wound care instructions given     Return for appointment as scheduled.  Luther Redo, CMA, am acting as scribe for [name], MD .  Documentation: I have reviewed the above documentation for accuracy and completeness, and I agree with the above.  [name], MD

## 2021-06-24 NOTE — Patient Instructions (Signed)

## 2021-07-05 ENCOUNTER — Encounter: Payer: Self-pay | Admitting: Dermatology

## 2021-07-08 ENCOUNTER — Other Ambulatory Visit (HOSPITAL_COMMUNITY): Payer: Self-pay | Admitting: Internal Medicine

## 2021-07-08 DIAGNOSIS — Z1231 Encounter for screening mammogram for malignant neoplasm of breast: Secondary | ICD-10-CM

## 2021-07-12 DIAGNOSIS — H26491 Other secondary cataract, right eye: Secondary | ICD-10-CM | POA: Diagnosis not present

## 2021-07-12 DIAGNOSIS — H01002 Unspecified blepharitis right lower eyelid: Secondary | ICD-10-CM | POA: Diagnosis not present

## 2021-07-12 DIAGNOSIS — H353121 Nonexudative age-related macular degeneration, left eye, early dry stage: Secondary | ICD-10-CM | POA: Diagnosis not present

## 2021-07-12 DIAGNOSIS — H01001 Unspecified blepharitis right upper eyelid: Secondary | ICD-10-CM | POA: Diagnosis not present

## 2021-08-02 DIAGNOSIS — Z23 Encounter for immunization: Secondary | ICD-10-CM | POA: Diagnosis not present

## 2021-08-11 DIAGNOSIS — R000 Tachycardia, unspecified: Secondary | ICD-10-CM | POA: Diagnosis not present

## 2021-08-13 ENCOUNTER — Other Ambulatory Visit: Payer: Self-pay | Admitting: Internal Medicine

## 2021-08-13 ENCOUNTER — Ambulatory Visit (INDEPENDENT_AMBULATORY_CARE_PROVIDER_SITE_OTHER): Payer: Medicare Other

## 2021-08-13 DIAGNOSIS — R000 Tachycardia, unspecified: Secondary | ICD-10-CM

## 2021-08-16 DIAGNOSIS — R000 Tachycardia, unspecified: Secondary | ICD-10-CM | POA: Diagnosis not present

## 2021-08-23 ENCOUNTER — Ambulatory Visit (HOSPITAL_COMMUNITY): Payer: Medicare Other

## 2021-08-25 ENCOUNTER — Other Ambulatory Visit: Payer: Self-pay

## 2021-08-25 ENCOUNTER — Ambulatory Visit (HOSPITAL_COMMUNITY)
Admission: RE | Admit: 2021-08-25 | Discharge: 2021-08-25 | Disposition: A | Discharge status: Home / Self Care | Payer: Medicare Other | Source: Ambulatory Visit | Attending: Internal Medicine | Admitting: Internal Medicine

## 2021-08-25 DIAGNOSIS — Z1231 Encounter for screening mammogram for malignant neoplasm of breast: Secondary | ICD-10-CM | POA: Diagnosis not present

## 2021-08-30 ENCOUNTER — Other Ambulatory Visit (HOSPITAL_COMMUNITY): Payer: Self-pay | Admitting: Internal Medicine

## 2021-08-30 DIAGNOSIS — R928 Other abnormal and inconclusive findings on diagnostic imaging of breast: Secondary | ICD-10-CM

## 2021-08-30 DIAGNOSIS — R000 Tachycardia, unspecified: Secondary | ICD-10-CM | POA: Diagnosis not present

## 2021-09-08 DIAGNOSIS — E559 Vitamin D deficiency, unspecified: Secondary | ICD-10-CM | POA: Diagnosis not present

## 2021-09-08 DIAGNOSIS — K219 Gastro-esophageal reflux disease without esophagitis: Secondary | ICD-10-CM | POA: Diagnosis not present

## 2021-09-14 ENCOUNTER — Other Ambulatory Visit: Payer: Self-pay

## 2021-09-14 ENCOUNTER — Ambulatory Visit (HOSPITAL_COMMUNITY)
Admission: RE | Admit: 2021-09-14 | Discharge: 2021-09-14 | Disposition: A | Discharge status: Home / Self Care | Payer: Medicare Other | Source: Ambulatory Visit | Attending: Internal Medicine | Admitting: Internal Medicine

## 2021-09-14 DIAGNOSIS — R928 Other abnormal and inconclusive findings on diagnostic imaging of breast: Secondary | ICD-10-CM | POA: Diagnosis not present

## 2021-09-14 DIAGNOSIS — R922 Inconclusive mammogram: Secondary | ICD-10-CM | POA: Diagnosis not present

## 2021-10-06 DIAGNOSIS — Z79899 Other long term (current) drug therapy: Secondary | ICD-10-CM | POA: Diagnosis not present

## 2021-10-06 DIAGNOSIS — N183 Chronic kidney disease, stage 3 unspecified: Secondary | ICD-10-CM | POA: Diagnosis not present

## 2021-10-06 DIAGNOSIS — E785 Hyperlipidemia, unspecified: Secondary | ICD-10-CM | POA: Diagnosis not present

## 2021-10-06 DIAGNOSIS — N1832 Chronic kidney disease, stage 3b: Secondary | ICD-10-CM | POA: Diagnosis not present

## 2021-10-06 DIAGNOSIS — E1129 Type 2 diabetes mellitus with other diabetic kidney complication: Secondary | ICD-10-CM | POA: Diagnosis not present

## 2021-10-06 DIAGNOSIS — E559 Vitamin D deficiency, unspecified: Secondary | ICD-10-CM | POA: Diagnosis not present

## 2021-10-06 DIAGNOSIS — I10 Essential (primary) hypertension: Secondary | ICD-10-CM | POA: Diagnosis not present

## 2021-10-06 DIAGNOSIS — R000 Tachycardia, unspecified: Secondary | ICD-10-CM | POA: Diagnosis not present

## 2021-10-13 DIAGNOSIS — N1832 Chronic kidney disease, stage 3b: Secondary | ICD-10-CM | POA: Diagnosis not present

## 2021-10-13 DIAGNOSIS — E1122 Type 2 diabetes mellitus with diabetic chronic kidney disease: Secondary | ICD-10-CM | POA: Diagnosis not present

## 2021-10-13 DIAGNOSIS — E785 Hyperlipidemia, unspecified: Secondary | ICD-10-CM | POA: Diagnosis not present

## 2021-10-13 DIAGNOSIS — Z0000 Encounter for general adult medical examination without abnormal findings: Secondary | ICD-10-CM | POA: Diagnosis not present

## 2021-10-13 DIAGNOSIS — I10 Essential (primary) hypertension: Secondary | ICD-10-CM | POA: Diagnosis not present

## 2021-11-23 DIAGNOSIS — I10 Essential (primary) hypertension: Secondary | ICD-10-CM | POA: Diagnosis not present

## 2021-11-23 DIAGNOSIS — L500 Allergic urticaria: Secondary | ICD-10-CM | POA: Diagnosis not present

## 2021-11-23 DIAGNOSIS — L509 Urticaria, unspecified: Secondary | ICD-10-CM | POA: Diagnosis not present

## 2021-12-27 DIAGNOSIS — I10 Essential (primary) hypertension: Secondary | ICD-10-CM | POA: Diagnosis not present

## 2021-12-27 DIAGNOSIS — N183 Chronic kidney disease, stage 3 unspecified: Secondary | ICD-10-CM | POA: Diagnosis not present

## 2022-01-10 DIAGNOSIS — E119 Type 2 diabetes mellitus without complications: Secondary | ICD-10-CM | POA: Diagnosis not present

## 2022-01-17 DIAGNOSIS — N1832 Chronic kidney disease, stage 3b: Secondary | ICD-10-CM | POA: Diagnosis not present

## 2022-01-17 DIAGNOSIS — I10 Essential (primary) hypertension: Secondary | ICD-10-CM | POA: Diagnosis not present

## 2022-01-17 DIAGNOSIS — Z79899 Other long term (current) drug therapy: Secondary | ICD-10-CM | POA: Diagnosis not present

## 2022-01-31 ENCOUNTER — Other Ambulatory Visit (HOSPITAL_COMMUNITY): Payer: Self-pay | Admitting: Internal Medicine

## 2022-01-31 DIAGNOSIS — N6310 Unspecified lump in the right breast, unspecified quadrant: Secondary | ICD-10-CM

## 2022-02-07 DIAGNOSIS — N1832 Chronic kidney disease, stage 3b: Secondary | ICD-10-CM | POA: Diagnosis not present

## 2022-02-07 DIAGNOSIS — R7309 Other abnormal glucose: Secondary | ICD-10-CM | POA: Diagnosis not present

## 2022-02-07 DIAGNOSIS — I10 Essential (primary) hypertension: Secondary | ICD-10-CM | POA: Diagnosis not present

## 2022-02-07 DIAGNOSIS — E1122 Type 2 diabetes mellitus with diabetic chronic kidney disease: Secondary | ICD-10-CM | POA: Diagnosis not present

## 2022-02-26 ENCOUNTER — Emergency Department (HOSPITAL_COMMUNITY): Payer: Medicare Other

## 2022-02-26 ENCOUNTER — Emergency Department (HOSPITAL_COMMUNITY)
Admission: EM | Admit: 2022-02-26 | Discharge: 2022-02-26 | Disposition: A | Discharge status: Home / Self Care | Payer: Medicare Other | Attending: Emergency Medicine | Admitting: Emergency Medicine

## 2022-02-26 ENCOUNTER — Encounter (HOSPITAL_COMMUNITY): Payer: Self-pay | Admitting: Emergency Medicine

## 2022-02-26 ENCOUNTER — Other Ambulatory Visit: Payer: Self-pay

## 2022-02-26 DIAGNOSIS — I4891 Unspecified atrial fibrillation: Secondary | ICD-10-CM

## 2022-02-26 DIAGNOSIS — Z7984 Long term (current) use of oral hypoglycemic drugs: Secondary | ICD-10-CM | POA: Insufficient documentation

## 2022-02-26 DIAGNOSIS — E119 Type 2 diabetes mellitus without complications: Secondary | ICD-10-CM | POA: Insufficient documentation

## 2022-02-26 DIAGNOSIS — Z79899 Other long term (current) drug therapy: Secondary | ICD-10-CM | POA: Diagnosis not present

## 2022-02-26 DIAGNOSIS — R000 Tachycardia, unspecified: Secondary | ICD-10-CM | POA: Diagnosis not present

## 2022-02-26 DIAGNOSIS — I10 Essential (primary) hypertension: Secondary | ICD-10-CM | POA: Diagnosis not present

## 2022-02-26 LAB — CBC
HCT: 44.1 % (ref 36.0–46.0)
Hemoglobin: 14.2 g/dL (ref 12.0–15.0)
MCH: 30.2 pg (ref 26.0–34.0)
MCHC: 32.2 g/dL (ref 30.0–36.0)
MCV: 93.8 fL (ref 80.0–100.0)
Platelets: 446 10*3/uL — ABNORMAL HIGH (ref 150–400)
RBC: 4.7 MIL/uL (ref 3.87–5.11)
RDW: 12.3 % (ref 11.5–15.5)
WBC: 10.5 10*3/uL (ref 4.0–10.5)
nRBC: 0 % (ref 0.0–0.2)

## 2022-02-26 LAB — BASIC METABOLIC PANEL
Anion gap: 9 (ref 5–15)
BUN: 21 mg/dL (ref 8–23)
CO2: 26 mmol/L (ref 22–32)
Calcium: 9.2 mg/dL (ref 8.9–10.3)
Chloride: 104 mmol/L (ref 98–111)
Creatinine, Ser: 1.28 mg/dL — ABNORMAL HIGH (ref 0.44–1.00)
GFR, Estimated: 44 mL/min — ABNORMAL LOW (ref >60)
Glucose, Bld: 129 mg/dL — ABNORMAL HIGH (ref 70–99)
Potassium: 3.9 mmol/L (ref 3.5–5.1)
Sodium: 139 mmol/L (ref 135–145)

## 2022-02-26 LAB — MAGNESIUM: Magnesium: 1.8 mg/dL (ref 1.7–2.4)

## 2022-02-26 MED ORDER — APIXABAN (ELIQUIS) VTE STARTER PACK (10MG AND 5MG): ORAL_TABLET | ORAL | 0 refills | Status: DC

## 2022-02-26 NOTE — Discharge Instructions (Addendum)
Please stop taking aspirin and start taking Eliquis.  Please take as prescribed.  I given you a starter pack.  I have given you referral to the atrial fibrillation clinic.  They should be calling you.  I have also attached the number in the event that they do not.  Please return to the emergency department for any worsening symptoms you might have.

## 2022-02-26 NOTE — ED Triage Notes (Signed)
Patient c/o tachycardia. Per patient checked Apple Watch and HR was 135, done Apple Watch EKG feature and was noted to be in A-fib. Per patient has had that happen once before and it corrected itself, told PCP and was told to come to ED if it happened again. Denies any cardiac hx. Denies any chest pain, shortness of breath, or other symptoms.

## 2022-02-26 NOTE — ED Provider Triage Note (Signed)
Emergency Medicine Provider Triage Evaluation Note  Jennifer Morrison , a 76 y.o. female  was evaluated in triage.  Pt complains of new onset palpitations and apple watch id a fib.  Review of Systems  Positive: Recent similar event Negative: Pain fever  Physical Exam  BP (!) 156/96 (BP Location: Right Arm)   Pulse (!) 48   Temp 98 F (36.7 C) (Oral)   Resp 20   Ht 1.575 m ('5\' 2"'$ )   Wt 78 kg   SpO2 100%   BMI 31.46 kg/m  Gen:   Awake, no distress   Resp:  Normal effort  MSK:   Moves extremities without difficulty  Other:  Irregularly irregular  Medical Decision Making  Medically screening exam initiated at 11:16 AM.  Appropriate orders placed.  Jennifer Morrison was informed that the remainder of the evaluation will be completed by another provider, this initial triage assessment does not replace that evaluation, and the importance of remaining in the ED until their evaluation is complete.  A fib order set placed   Jennifer Boss, MD 02/26/22 [phone]

## 2022-02-26 NOTE — ED Provider Notes (Signed)
[facility] EMERGENCY DEPARTMENT Provider Note   CSN: [id number] Arrival date & time: 02/26/22  1054     History Chief Complaint  Patient presents with   Tachycardia    Jennifer Morrison is a 76 y.o. female with history of diabetes, hypertension, hyperlipidemia who presents to the emergency department with atrial fibrillation that started around 9 AM.  Patient was notified by her Apple Watch that she was in atrial fibrillation.  She was asymptomatic at that time.  Patient arrived to the emergency department in atrial fibrillation with RVR approximate going between 110 and 120.  Again patient was having no symptoms.  She denies chest pain, shortness of breath, fever, chills.  Patient currently on carvedilol but is not anticoagulated apart from aspirin.  HPI     Home Medications Prior to Admission medications   Medication Sig Start Date End Date Taking? Authorizing Provider  APIXABAN (ELIQUIS) VTE STARTER PACK ('10MG'$  AND '5MG'$ ) Take as directed on package: start with two-'5mg'$  tablets twice daily for 7 days. On day 8, switch to one-'5mg'$  tablet twice daily. 02/26/22  Yes Cythnia Osmun M, PA-C  ACCU-CHEK AVIVA PLUS test strip 1 each by Other route daily.  06/29/16   [provider]  ACCU-CHEK FASTCLIX LANCETS MISC 1 each by Other route daily.  07/27/16   [provider]  amLODipine (NORVASC) 5 MG tablet Take 5 mg by mouth daily.    [provider]  clobetasol cream (TEMOVATE) 6.76 % Apply 1 application topically 2 (two) times daily as needed (eczema).    [provider]  ketoconazole (NIZORAL) 2 % shampoo Apply 1 application topically See admin instructions. apply three times per week, massage into scalp and leave in for 10 minutes before rinsing out 11/11/20   Moye, [place], MD  metFORMIN (GLUCOPHAGE) 500 MG tablet Take 500 mg by mouth 2 (two) times daily.  07/07/16   [provider]  mupirocin ointment (BACTROBAN) 2 % Apply 1 application topically 2 (two)  times daily. Apply to wound until healed Patient not taking: Reported on 06/24/2021 11/11/20   Laurence Ferrari, [place], MD  omeprazole (PRILOSEC) 20 MG capsule Take 20 mg by mouth daily.    [provider]  pravastatin (PRAVACHOL) 20 MG tablet Take 20 mg by mouth every evening.     [provider]  valsartan-hydrochlorothiazide (DIOVAN-HCT) 160-25 MG tablet Take 1 tablet by mouth daily.    [provider]      Allergies    Patient has no known allergies.    Review of Systems   Review of Systems  All other systems reviewed and are negative.  Physical Exam Updated Vital Signs BP (!) 146/62   Pulse 64   Temp 98 F (36.7 C) (Oral)   Resp 15   Ht '5\' 2"'$  (1.575 m)   Wt 78 kg   SpO2 98%   BMI 31.46 kg/m  Physical Exam Vitals and nursing note reviewed.  Constitutional:      General: She is not in acute distress.    Appearance: Normal appearance.  HENT:     Head: Normocephalic and atraumatic.  Eyes:     General:        Right eye: No discharge.        Left eye: No discharge.  Cardiovascular:     Comments: Regular rate and rhythm.  S1/S2 are distinct without any evidence of murmur, rubs, or gallops.  Radial pulses are 2+ bilaterally.  Dorsalis pedis pulses are 2+ bilaterally.  No  evidence of pedal edema. Pulmonary:     Comments: Clear to auscultation bilaterally.  Normal effort.  No respiratory distress.  No evidence of wheezes, rales, or rhonchi heard throughout. Abdominal:     General: Abdomen is flat. Bowel sounds are normal. There is no distension.     Tenderness: There is no abdominal tenderness. There is no guarding or rebound.  Musculoskeletal:        General: Normal range of motion.     Cervical back: Neck supple.  Skin:    General: Skin is warm and dry.     Findings: No rash.  Neurological:     General: No focal deficit present.     Mental Status: She is alert.  Psychiatric:        Mood and Affect: Mood normal.        Behavior: Behavior normal.     ED Results / Procedures / Treatments   Labs (all labs ordered are listed, but only abnormal results are displayed) Labs Reviewed  BASIC METABOLIC PANEL - Abnormal; Notable for the following components:      Result Value   Glucose, Bld 129 (*)    Creatinine, Ser 1.28 (*)    GFR, Estimated 44 (*)    All other components within normal limits  CBC - Abnormal; Notable for the following components:   Platelets 446 (*)    All other components within normal limits  MAGNESIUM    EKG EKG Interpretation  Date/Time:  Saturday Feb 26 2022 11:09:45 EDT Ventricular Rate:  114 PR Interval:    QRS Duration: 80 QT Interval:  308 QTC Calculation: 424 R Axis:   -11 Text Interpretation: Atrial fibrillation with rapid ventricular response with premature ventricular or aberrantly conducted complexes Minimal voltage criteria for LVH, may be normal variant ( R in aVL ) Anterolateral infarct , age undetermined Abnormal ECG When compared with ECG of 30-May-2018 08:25, Atrial fibrillation has replaced Sinus rhythm Vent. rate has increased BY  38 BPM Anterolateral infarct is now Present Nonspecific T wave abnormality now evident in Lateral leads Confirmed by [name] [phone]) on 02/26/2022 11:12:48 AM  Radiology DG Chest 2 View  Result Date: 02/26/2022 CLINICAL DATA:  Tachycardia.  Atrial fibrillation. EXAM: CHEST - 2 VIEW COMPARISON:  08/30/2016 FINDINGS: The heart size and mediastinal contours are within normal limits. Both lungs are clear. The visualized skeletal structures are unremarkable. IMPRESSION: No active cardiopulmonary disease. Electronically Signed   By: Marlaine Hind M.D.   On: 02/26/2022 11:31    Procedures Procedures    Medications Ordered in ED Medications - No data to display  ED Course/ Medical Decision Making/ A&P Clinical Course as of 02/26/22 1415  Sat Feb 26, 2022  1346 No significant abnormalities apart from thrombocytosis. [CF]  7169 Basic metabolic panel(!) Elevated  glucose slightly and elevated creatinine.  Creatinine is better than previous results. [CF]  1347 Magnesium Magnesium is normal. [CF]  1349 DG Chest 2 View No evidence of pneumonia or pleural effusions.  I also agree with the radiologist interpretation. [CF]    Clinical Course User Index [CF] Hendricks Limes, PA-C                           Medical Decision Making ADDYSEN LOUTH is a 76 y.o. female who presents to the emergency department with atrial fibrillation RVR.  Initial EKG did show evidence of atrial fibrillation with RVR.  Ventricular rate was going about  115.  On my evaluation, patient seemed to spontaneously convert to normal sinus rhythm.  She is asymptomatic and has a heart rate in the 80s.  Her rhythm was regular on my auscultation.  I will review labs and get another EKG to ensure that she converted.   Amount and/or Complexity of Data Reviewed External Data Reviewed: notes.    Details: I reviewed a note from her long-term heart monitor that she wore back in November.  There were numerous episodes of SVT.  The predominating rhythm was sinus but she did have episodes of tachycardia and trigeminy. Labs: ordered. Decision-making details documented in ED Course. Radiology: ordered and independent interpretation performed. Decision-making details documented in ED Course. ECG/medicine tests: ordered and independent interpretation performed.    Details: I reviewed EKG which shows evidence of atrial fibrillation and RVR.  Ventricular rate is going about 115.  Risk Prescription drug management. Decision regarding hospitalization. Risk Details: Patient presented the emergency department with atrial fibrillation and rapid ventricular response.  Patient is already on beta-blocker.  Eliquis.  She spontaneously converted in the emergency department.  She continues to be asymptomatic and in normal sinus rhythm on repeat EKG and on the monitor.  All of her labs are all reassuring.  No  electrolyte abnormalities or inciting event.  We will give her referral to A-fib clinic in addition to having her follow-up with her PCP.  I written her prescription for Eliquis.  I instructed her to stop taking aspirin and start taking Eliquis.  Strict turn precautions were discussed.  She is safe for discharge.   Final Clinical Impression(s) / ED Diagnoses Final diagnoses:  Atrial fibrillation with RVR [facility])    Rx / DC Orders ED Discharge Orders          Ordered    Amb referral to AFIB Clinic        02/26/22 1116    APIXABAN (ELIQUIS) VTE STARTER PACK ('10MG'$  AND '5MG'$ )        02/26/22 1406              Myna Bright [place], [place] 02/26/22 1415    [name], MD 02/28/22 1400

## 2022-02-28 ENCOUNTER — Telehealth (HOSPITAL_COMMUNITY): Payer: Self-pay

## 2022-02-28 DIAGNOSIS — I480 Paroxysmal atrial fibrillation: Secondary | ICD-10-CM | POA: Diagnosis not present

## 2022-02-28 NOTE — Telephone Encounter (Signed)
Contacted patient to offer appointment to be seen for hospital follow up at the [facility]. Patient deferred appointment at this time. Dr. Willey Blade is ordering an Echocardiogram and other test and he would like for her to wait to be seen at the A-fib clinic. She was told to contact the clinic back if she wants to schedule appointment. Communicated with patient and she verbalized understanding.

## 2022-03-02 ENCOUNTER — Other Ambulatory Visit (HOSPITAL_COMMUNITY): Payer: Self-pay | Admitting: Internal Medicine

## 2022-03-02 DIAGNOSIS — I4891 Unspecified atrial fibrillation: Secondary | ICD-10-CM

## 2022-03-09 ENCOUNTER — Ambulatory Visit (HOSPITAL_COMMUNITY)
Admission: RE | Admit: 2022-03-09 | Discharge: 2022-03-09 | Disposition: A | Discharge status: Home / Self Care | Payer: Medicare Other | Source: Ambulatory Visit | Attending: Internal Medicine | Admitting: Internal Medicine

## 2022-03-09 DIAGNOSIS — I4891 Unspecified atrial fibrillation: Secondary | ICD-10-CM | POA: Diagnosis not present

## 2022-03-09 LAB — ECHOCARDIOGRAM COMPLETE
AV Mean grad: 4.8 mmHg
AV Peak grad: 9.4 mmHg
Ao pk vel: 1.53 m/s
Area-P 1/2: 4.21 cm2
S' Lateral: 2.6 cm

## 2022-03-09 NOTE — Progress Notes (Signed)
*  PRELIMINARY RESULTS* Echocardiogram 2D Echocardiogram has been performed.  Jennifer Morrison 03/09/2022, 1:35 PM

## 2022-03-20 ENCOUNTER — Other Ambulatory Visit: Payer: Self-pay

## 2022-03-20 ENCOUNTER — Emergency Department (HOSPITAL_COMMUNITY): Payer: Medicare Other

## 2022-03-20 ENCOUNTER — Observation Stay (HOSPITAL_COMMUNITY)
Admission: EM | Admit: 2022-03-20 | Discharge: 2022-03-21 | Disposition: A | Discharge status: Home / Self Care | Payer: Medicare Other | Attending: Internal Medicine | Admitting: Internal Medicine

## 2022-03-20 ENCOUNTER — Encounter (HOSPITAL_COMMUNITY): Payer: Self-pay | Admitting: *Deleted

## 2022-03-20 DIAGNOSIS — I5030 Unspecified diastolic (congestive) heart failure: Secondary | ICD-10-CM | POA: Diagnosis not present

## 2022-03-20 DIAGNOSIS — Z7984 Long term (current) use of oral hypoglycemic drugs: Secondary | ICD-10-CM | POA: Insufficient documentation

## 2022-03-20 DIAGNOSIS — Z6830 Body mass index (BMI) 30.0-30.9, adult: Secondary | ICD-10-CM | POA: Diagnosis not present

## 2022-03-20 DIAGNOSIS — Z85828 Personal history of other malignant neoplasm of skin: Secondary | ICD-10-CM | POA: Diagnosis not present

## 2022-03-20 DIAGNOSIS — E66811 Obesity, class 1: Secondary | ICD-10-CM | POA: Diagnosis present

## 2022-03-20 DIAGNOSIS — E669 Obesity, unspecified: Secondary | ICD-10-CM | POA: Diagnosis present

## 2022-03-20 DIAGNOSIS — Z7901 Long term (current) use of anticoagulants: Secondary | ICD-10-CM | POA: Diagnosis not present

## 2022-03-20 DIAGNOSIS — Z8542 Personal history of malignant neoplasm of other parts of uterus: Secondary | ICD-10-CM | POA: Insufficient documentation

## 2022-03-20 DIAGNOSIS — I10 Essential (primary) hypertension: Secondary | ICD-10-CM | POA: Diagnosis not present

## 2022-03-20 DIAGNOSIS — R002 Palpitations: Secondary | ICD-10-CM | POA: Diagnosis present

## 2022-03-20 DIAGNOSIS — I4891 Unspecified atrial fibrillation: Secondary | ICD-10-CM | POA: Diagnosis not present

## 2022-03-20 DIAGNOSIS — I110 Hypertensive heart disease with heart failure: Secondary | ICD-10-CM | POA: Insufficient documentation

## 2022-03-20 DIAGNOSIS — K219 Gastro-esophageal reflux disease without esophagitis: Secondary | ICD-10-CM | POA: Diagnosis present

## 2022-03-20 DIAGNOSIS — Z79899 Other long term (current) drug therapy: Secondary | ICD-10-CM | POA: Insufficient documentation

## 2022-03-20 DIAGNOSIS — C541 Malignant neoplasm of endometrium: Secondary | ICD-10-CM | POA: Diagnosis present

## 2022-03-20 DIAGNOSIS — E6609 Other obesity due to excess calories: Secondary | ICD-10-CM | POA: Insufficient documentation

## 2022-03-20 DIAGNOSIS — E119 Type 2 diabetes mellitus without complications: Secondary | ICD-10-CM | POA: Diagnosis not present

## 2022-03-20 LAB — URINALYSIS, ROUTINE W REFLEX MICROSCOPIC
Bilirubin Urine: NEGATIVE
Glucose, UA: NEGATIVE mg/dL
Hgb urine dipstick: NEGATIVE
Ketones, ur: NEGATIVE mg/dL
Leukocytes,Ua: NEGATIVE
Nitrite: NEGATIVE
Protein, ur: NEGATIVE mg/dL
Specific Gravity, Urine: 1.002 — ABNORMAL LOW (ref 1.005–1.030)
pH: 7 (ref 5.0–8.0)

## 2022-03-20 LAB — CBC WITH DIFFERENTIAL/PLATELET
Abs Immature Granulocytes: 0.03 10*3/uL (ref 0.00–0.07)
Basophils Absolute: 0 10*3/uL (ref 0.0–0.1)
Basophils Relative: 0 %
Eosinophils Absolute: 0.1 10*3/uL (ref 0.0–0.5)
Eosinophils Relative: 1 %
HCT: 40.3 % (ref 36.0–46.0)
Hemoglobin: 13.8 g/dL (ref 12.0–15.0)
Immature Granulocytes: 0 %
Lymphocytes Relative: 16 %
Lymphs Abs: 1.9 10*3/uL (ref 0.7–4.0)
MCH: 31.3 pg (ref 26.0–34.0)
MCHC: 34.2 g/dL (ref 30.0–36.0)
MCV: 91.4 fL (ref 80.0–100.0)
Monocytes Absolute: 0.6 10*3/uL (ref 0.1–1.0)
Monocytes Relative: 5 %
Neutro Abs: 9 10*3/uL — ABNORMAL HIGH (ref 1.7–7.7)
Neutrophils Relative %: 78 %
Platelets: 394 10*3/uL (ref 150–400)
RBC: 4.41 MIL/uL (ref 3.87–5.11)
RDW: 12.2 % (ref 11.5–15.5)
WBC: 11.6 10*3/uL — ABNORMAL HIGH (ref 4.0–10.5)
nRBC: 0 % (ref 0.0–0.2)

## 2022-03-20 LAB — COMPREHENSIVE METABOLIC PANEL
ALT: 18 U/L (ref 0–44)
AST: 21 U/L (ref 15–41)
Albumin: 3.8 g/dL (ref 3.5–5.0)
Alkaline Phosphatase: 40 U/L (ref 38–126)
Anion gap: 9 (ref 5–15)
BUN: 24 mg/dL — ABNORMAL HIGH (ref 8–23)
CO2: 22 mmol/L (ref 22–32)
Calcium: 9.6 mg/dL (ref 8.9–10.3)
Chloride: 103 mmol/L (ref 98–111)
Creatinine, Ser: 1.26 mg/dL — ABNORMAL HIGH (ref 0.44–1.00)
GFR, Estimated: 45 mL/min — ABNORMAL LOW (ref >60)
Glucose, Bld: 170 mg/dL — ABNORMAL HIGH (ref 70–99)
Potassium: 3.9 mmol/L (ref 3.5–5.1)
Sodium: 134 mmol/L — ABNORMAL LOW (ref 135–145)
Total Bilirubin: 0.9 mg/dL (ref 0.3–1.2)
Total Protein: 7.7 g/dL (ref 6.5–8.1)

## 2022-03-20 LAB — BRAIN NATRIURETIC PEPTIDE: B Natriuretic Peptide: 210 pg/mL — ABNORMAL HIGH (ref 0.0–100.0)

## 2022-03-20 LAB — MRSA NEXT GEN BY PCR, NASAL: MRSA by PCR Next Gen: NOT DETECTED

## 2022-03-20 LAB — MAGNESIUM: Magnesium: 1.5 mg/dL — ABNORMAL LOW (ref 1.7–2.4)

## 2022-03-20 LAB — TSH: TSH: 1.923 u[IU]/mL (ref 0.350–4.500)

## 2022-03-20 LAB — TROPONIN I (HIGH SENSITIVITY)
Troponin I (High Sensitivity): 5 ng/L (ref <18)
Troponin I (High Sensitivity): 6 ng/L (ref <18)

## 2022-03-20 LAB — GLUCOSE, CAPILLARY: Glucose-Capillary: 135 mg/dL — ABNORMAL HIGH (ref 70–99)

## 2022-03-20 MED ORDER — SODIUM CHLORIDE 0.9 % IV BOLUS
500.0000 mL | Freq: Once | INTRAVENOUS | Status: AC
  Administered 2022-03-20: 500 mL via INTRAVENOUS

## 2022-03-20 MED ORDER — CARVEDILOL 12.5 MG PO TABS
12.5000 mg | ORAL_TABLET | Freq: Two times a day (BID) | ORAL | Status: DC
  Administered 2022-03-20: 12.5 mg via ORAL
  Filled 2022-03-20: qty 1

## 2022-03-20 MED ORDER — ONDANSETRON HCL 4 MG PO TABS: 4.0000 mg | ORAL_TABLET | Freq: Four times a day (QID) | ORAL | Status: DC | PRN

## 2022-03-20 MED ORDER — ACETAMINOPHEN 650 MG RE SUPP: 650.0000 mg | Freq: Four times a day (QID) | RECTAL | Status: DC | PRN

## 2022-03-20 MED ORDER — DILTIAZEM HCL 25 MG/5ML IV SOLN
10.0000 mg | Freq: Once | INTRAVENOUS | Status: AC
  Administered 2022-03-20: 10 mg via INTRAVENOUS
  Filled 2022-03-20: qty 5

## 2022-03-20 MED ORDER — CARVEDILOL 3.125 MG PO TABS
3.1250 mg | ORAL_TABLET | Freq: Once | ORAL | Status: AC
  Administered 2022-03-20: 3.125 mg via ORAL
  Filled 2022-03-20: qty 1

## 2022-03-20 MED ORDER — INSULIN ASPART 100 UNIT/ML IJ SOLN
0.0000 [IU] | Freq: Three times a day (TID) | INTRAMUSCULAR | Status: DC
  Administered 2022-03-21: 2 [IU] via SUBCUTANEOUS
  Administered 2022-03-21: 1 [IU] via SUBCUTANEOUS

## 2022-03-20 MED ORDER — DILTIAZEM HCL-DEXTROSE 125-5 MG/125ML-% IV SOLN (PREMIX)
5.0000 mg/h | INTRAVENOUS | Status: DC
  Administered 2022-03-20: 5 mg/h via INTRAVENOUS
  Filled 2022-03-20: qty 125

## 2022-03-20 MED ORDER — VALSARTAN 160 MG PO TABS
160.0000 mg | ORAL_TABLET | Freq: Every day | ORAL | Status: DC
  Filled 2022-03-20 (×3): qty 1

## 2022-03-20 MED ORDER — MAGNESIUM SULFATE IN D5W 1-5 GM/100ML-% IV SOLN
1.0000 g | Freq: Once | INTRAVENOUS | Status: AC
  Administered 2022-03-20: 1 g via INTRAVENOUS
  Filled 2022-03-20: qty 100

## 2022-03-20 MED ORDER — VALSARTAN-HYDROCHLOROTHIAZIDE 160-25 MG PO TABS
1.0000 | ORAL_TABLET | Freq: Every day | ORAL | Status: DC
Start: 2022-03-21 — End: 2022-03-20

## 2022-03-20 MED ORDER — INSULIN ASPART 100 UNIT/ML IJ SOLN: 0.0000 [IU] | Freq: Every day | INTRAMUSCULAR | Status: DC

## 2022-03-20 MED ORDER — APIXABAN (ELIQUIS) VTE STARTER PACK (10MG AND 5MG)
5.0000 mg | ORAL_TABLET | Freq: Two times a day (BID) | ORAL | Status: DC
Start: 2022-03-20 — End: 2022-03-20

## 2022-03-20 MED ORDER — MAGNESIUM SULFATE IN D5W 1-5 GM/100ML-% IV SOLN
1.0000 g | Freq: Once | INTRAVENOUS | Status: AC
Start: 2022-03-20 — End: 2022-03-20
  Administered 2022-03-20: 1 g via INTRAVENOUS
  Filled 2022-03-20: qty 100

## 2022-03-20 MED ORDER — LABETALOL HCL 5 MG/ML IV SOLN
10.0000 mg | Freq: Once | INTRAVENOUS | Status: AC
  Administered 2022-03-20: 10 mg via INTRAVENOUS
  Filled 2022-03-20: qty 4

## 2022-03-20 MED ORDER — ONDANSETRON HCL 4 MG/2ML IJ SOLN: 4.0000 mg | Freq: Four times a day (QID) | INTRAMUSCULAR | Status: DC | PRN

## 2022-03-20 MED ORDER — HYDROCHLOROTHIAZIDE 25 MG PO TABS
25.0000 mg | ORAL_TABLET | Freq: Every day | ORAL | Status: DC
Start: 2022-03-21 — End: 2022-03-21

## 2022-03-20 MED ORDER — APIXABAN 5 MG PO TABS
5.0000 mg | ORAL_TABLET | Freq: Two times a day (BID) | ORAL | Status: DC
  Administered 2022-03-20 – 2022-03-21 (×2): 5 mg via ORAL
  Filled 2022-03-20 (×2): qty 1

## 2022-03-20 MED ORDER — ACETAMINOPHEN 325 MG PO TABS
650.0000 mg | ORAL_TABLET | Freq: Four times a day (QID) | ORAL | Status: DC | PRN
  Filled 2022-03-20: qty 2

## 2022-03-20 MED ORDER — PRAVASTATIN SODIUM 10 MG PO TABS
20.0000 mg | ORAL_TABLET | Freq: Every evening | ORAL | Status: DC
  Administered 2022-03-20: 20 mg via ORAL
  Filled 2022-03-20: qty 2

## 2022-03-20 MED ORDER — POLYETHYLENE GLYCOL 3350 17 G PO PACK: 17.0000 g | PACK | Freq: Every day | ORAL | Status: DC | PRN

## 2022-03-20 MED ORDER — CHLORHEXIDINE GLUCONATE CLOTH 2 % EX PADS
6.0000 | MEDICATED_PAD | Freq: Every day | CUTANEOUS | Status: DC
  Administered 2022-03-20 – 2022-03-21 (×2): 6 via TOPICAL

## 2022-03-20 MED ORDER — PANTOPRAZOLE SODIUM 40 MG PO TBEC
40.0000 mg | DELAYED_RELEASE_TABLET | Freq: Every day | ORAL | Status: DC
  Administered 2022-03-21: 40 mg via ORAL
  Filled 2022-03-20: qty 1

## 2022-03-20 NOTE — H&P (Signed)
History and Physical    Jennifer Morrison DOB: June 04, 1946 DOA: 03/20/2022  PCP: Asencion Noble, MD   Patient coming from: Home  I have personally briefly reviewed patient's old medical records in [place]  Chief Complaint: Palpitations  HPI: Jennifer Morrison is a 76 y.o. female with medical history significant for  DM, HTN, new atria fib diagnosis. Patient presented to the ED today with complaints of palpitation which she noticed about the time that her wristwatch told that she was in atrial fibrillation.  Patient was in the ED 5/20-presented to the ED after her apple wristwatch told her she was in atrial fibrillation.  She was asymptomatic.  Heart rate was 1 10-1 20.  She converted in the ED to sinus rhythm spontaneously.  Patient was already on carvedilol she reports she was started on anticoagulation with Eliquis.  She had echocardiogram done as an outpatient.  She was to establish care with a cardiologist, but she has not been able to get in and does not have an appointment.  Today other than palpitation, she denies chest pain or difficulty breathing.  No dizziness.  No weakness of extremities.  She has been compliant with both Eliquis and carvedilol.  She has no other symptoms or complaints.  ED Course: Temperature 98.  Heart rate initially 120s to 130s, up to 140s with ambulation.  Blood pressure systolic 426S to 341D. Troponin 5 > 6.  EKG shows atrial fibrillation rate 134.  Magnesium 1.5.  BNP 210.  TSH 1.9. Carvedilol 3.125 mg given in ED, labetalol 10 mg given, with some transient improvement in heart rate.  With persistent tachycardia, 10 mg bolus Cardizem given, and Cardizem drip started.   Review of Systems: As per HPI all other systems reviewed and negative.  Past Medical History:  Diagnosis Date   Actinic keratosis 08/23/2018   L lat calf - bx proven   Actinic keratosis 06/24/2021   R med thigh - bx proven   Basal cell carcinoma 09/12/2019   R nasal facial  angle near eyelid   Diabetes mellitus without complication (HCC)    NIDDM x 1 yr   GERD (gastroesophageal reflux disease)    Hypercholesteremia    Hypertension    Uterine cancer ([place]) 08/14/2016    Past Surgical History:  Procedure Laterality Date   ABDOMINAL HYSTERECTOMY     CATARACT EXTRACTION W/PHACO Right 06/04/2018   Procedure: CATARACT EXTRACTION PHACO AND INTRAOCULAR LENS PLACEMENT RIGHT EYE CDE=7.64;  Surgeon: Tonny Branch, MD;  Location: [facility];  Service: Ophthalmology;  Laterality: Right;  right   CATARACT EXTRACTION W/PHACO Left 07/09/2018   Procedure: CATARACT EXTRACTION PHACO AND INTRAOCULAR LENS PLACEMENT (IOC);  Surgeon: Tonny Branch, MD;  Location: [facility];  Service: Ophthalmology;  Laterality: Left;  CDE: 5.53   COLONOSCOPY  2005   COLONOSCOPY N/A 11/28/2013   Procedure: COLONOSCOPY;  Surgeon: Rogene Houston, MD;  Location: [facility];  Service: Endoscopy;  Laterality: N/A;  830   Polyp removed from uterus     ROBOTIC ASSISTED LAP VAGINAL HYSTERECTOMY N/A 09/06/2016   Procedure: XI ROBOTIC ASSISTED LAPAROSCOPIC TOTAL HYSTERECTOMY;  Surgeon: Everitt Amber, MD;  Location: [facility];  Service: Gynecology;  Laterality: N/A;   ROBOTIC ASSISTED SALPINGO OOPHERECTOMY Bilateral 09/06/2016   Procedure: XI ROBOTIC ASSISTED SALPINGO OOPHORECTOMY;  Surgeon: Everitt Amber, MD;  Location: [facility];  Service: Gynecology;  Laterality: Bilateral;   SENTINEL NODE BIOPSY N/A 09/06/2016   Procedure: SENTINEL NODE BIOPSY;  Surgeon: Everitt Amber,  MD;  Location: [facility];  Service: Gynecology;  Laterality: N/A;     reports that she has never smoked. She has never used smokeless tobacco. She reports that she does not drink alcohol and does not use drugs.  No Known Allergies  Family History  Problem Relation Age of Onset   Diabetes Father    Diabetes Brother    Colon cancer Neg Hx     Prior to Admission medications   Medication Sig Start Date End Date Taking? Authorizing Provider  amLODipine  (NORVASC) 5 MG tablet Take 5 mg by mouth daily.   Yes [provider]  APIXABAN Arne Cleveland) VTE STARTER PACK ('10MG'$  AND '5MG'$ ) Take as directed on package: start with two-'5mg'$  tablets twice daily for 7 days. On day 8, switch to one-'5mg'$  tablet twice daily. Patient taking differently: Take 5 mg by mouth 2 (two) times daily. 02/26/22  Yes Raul Del, Conner M, PA-C  carvedilol (COREG) 6.25 MG tablet Take 6.25 mg by mouth 2 (two) times daily. 01/03/22  Yes [provider]  metFORMIN (GLUCOPHAGE) 500 MG tablet Take 500 mg by mouth 2 (two) times daily.  07/07/16  Yes [provider]  omeprazole (PRILOSEC) 20 MG capsule Take 20 mg by mouth daily.   Yes [provider]  pravastatin (PRAVACHOL) 20 MG tablet Take 20 mg by mouth every evening.    Yes [provider]  valsartan-hydrochlorothiazide (DIOVAN-HCT) 160-25 MG tablet Take 1 tablet by mouth daily.   Yes [provider]    Physical Exam: Vitals:   03/20/22 1645 03/20/22 1651 03/20/22 1742 03/20/22 1800  BP:    (!) 125/58  Pulse: (!) 103 90  (!) 105  Resp: '17 18  15  '$ Temp:    97.8 F (36.6 C)  TempSrc:    Oral  SpO2: 98% 99%  99%  Weight:   78.8 kg   Height:   '5\' 2"'$  (1.575 m)     Constitutional: NAD, calm, comfortable Vitals:   03/20/22 1645 03/20/22 1651 03/20/22 1742 03/20/22 1800  BP:    (!) 125/58  Pulse: (!) 103 90  (!) 105  Resp: '17 18  15  '$ Temp:    97.8 F (36.6 C)  TempSrc:    Oral  SpO2: 98% 99%  99%  Weight:   78.8 kg   Height:   '5\' 2"'$  (1.575 m)    Eyes: PERRL, lids and conjunctivae normal ENMT: Mucous membranes are moist.  Neck: normal, supple, no masses, no thyromegaly Respiratory: clear to auscultation bilaterally, no wheezing, no crackles. Normal respiratory effort. No accessory muscle use.  Cardiovascular: Regular rate and rhythm, no murmurs / rubs / gallops. No extremity edema.  Abdomen: no tenderness, no masses palpated. No hepatosplenomegaly. Bowel sounds positive.   Musculoskeletal: no clubbing / cyanosis. No joint deformity upper and lower extremities.  Skin: no rashes, lesions, ulcers. No induration Neurologic: No apparent cranial nerve abnormality, moving extremities spontaneously. Psychiatric: Normal judgment and insight. Alert and oriented x 3. Normal mood.   Labs on Admission: I have personally reviewed following labs and imaging studies  CBC: Recent Labs  Lab 03/20/22 1224  WBC 11.6*  NEUTROABS 9.0*  HGB 13.8  HCT 40.3  MCV 91.4  PLT 147   Basic Metabolic Panel: Recent Labs  Lab 03/20/22 1224  NA 134*  K 3.9  CL 103  CO2 22  GLUCOSE 170*  BUN 24*  CREATININE 1.26*  CALCIUM 9.6  MG 1.5*   GFR: Estimated Creatinine Clearance: 38.7 mL/min (  A) (by C-G formula based on SCr of 1.26 mg/dL (H)). Liver Function Tests: Recent Labs  Lab 03/20/22 1224  AST 21  ALT 18  ALKPHOS 40  BILITOT 0.9  PROT 7.7  ALBUMIN 3.8   Thyroid Function Tests: Recent Labs    03/20/22 1224  TSH 1.923   Urine analysis:    Component Value Date/Time   COLORURINE COLORLESS (A) 03/20/2022 1257   APPEARANCEUR CLEAR 03/20/2022 1257   LABSPEC 1.002 (L) 03/20/2022 1257   PHURINE 7.0 03/20/2022 1257   GLUCOSEU NEGATIVE 03/20/2022 1257   HGBUR NEGATIVE 03/20/2022 1257   BILIRUBINUR NEGATIVE 03/20/2022 1257   KETONESUR NEGATIVE 03/20/2022 1257   PROTEINUR NEGATIVE 03/20/2022 1257   NITRITE NEGATIVE 03/20/2022 1257   LEUKOCYTESUR NEGATIVE 03/20/2022 1257    Radiological Exams on Admission: DG Chest Port 1 View  Result Date: 03/20/2022 CLINICAL DATA:  Atrial fibrillation. EXAM: PORTABLE CHEST 1 VIEW COMPARISON:  02/26/2022 FINDINGS: Heart size and mediastinal contours appear within normal limits. No pleural effusion or edema. No airspace opacities identified. Visualized osseous structures are unremarkable. IMPRESSION: No active disease. Electronically Signed   By: Kerby Moors M.D.   On: 03/20/2022 13:39    EKG: Independently reviewed.  Atrial  fibrillation rate 134, QTc 478.  Assessment/Plan Principal Problem:   Atrial fibrillation with rapid ventricular response (HCC) Active Problems:   Endometrial cancer (HCC)   DM (diabetes mellitus) ([place])   HTN (hypertension)   Assessment and Plan: * Atrial fibrillation with rapid ventricular response (HCC) Symptomatic with palpitations, heart rate 120s to 130s, and up to 140s with ambulation.  Recent diagnosis 5/20-spontaneously converted to sinus rhythm in ED.  Has since been compliant with Eliquis, was previously on carvedilol.   - Has not been able to establish care with cardiology as outpatient, will consult cardiology -Recent echo 5/31 EF 60 to 65%, G1DD - IV Cardizem 10 mg bolus given, continue Cardizem drip -Mild hypomagnesemia, repleted -TSH and troponin x2 - WNL -Resume Eliquis -Resume home carvedilol at increased dose 12.'5mg'$  BID  HTN (hypertension) Stable. -Increase carvedilol to 12.5 mg twice daily. -Resume valsartan HCTZ -Hold Norvasc 5 mg for now to allow for titration of rate limiting meds  DM (diabetes mellitus) ([place]) - SSI- S -Hold metformin for now - HgbA1c  Endometrial cancer (HCC)  Stage IA grade 1 endometrioid endometrial cancer s/p robotic assisted hysterectomy, BSO and staging on 09/06/16.  No longer under surveillance.   DVT prophylaxis: Eliquis Code Status: Full Family Communication: Son and daughter-in-law at bedside Disposition Plan: ~ 1 - 2 days Consults called: Cards Admission status:  Inpt step down   Author: Bethena Roys, MD 03/20/2022 6:57 PM  For on call review [email or web address].

## 2022-03-20 NOTE — ED Triage Notes (Signed)
Pt states her watch tells her she is in afib; pt was seen here last month for same complaint and started on elequis; pt's PCP has made a referral to cardiologist bu they have not notified her yet of an appointment  Pt denies any chest pain, just c/o feeling tired and sleepy

## 2022-03-20 NOTE — Assessment & Plan Note (Signed)
Stage IA grade 1 endometrioid endometrial cancer s/p robotic assisted hysterectomy, BSO and staging on 09/06/16.  No longer under surveillance.

## 2022-03-20 NOTE — Assessment & Plan Note (Signed)
-   SSI- S -Hold metformin for now - HgbA1c

## 2022-03-20 NOTE — Assessment & Plan Note (Signed)
Stable. -Increase carvedilol to 12.5 mg twice daily. -Resume valsartan HCTZ -Hold Norvasc 5 mg for now to allow for titration of rate limiting meds

## 2022-03-20 NOTE — Assessment & Plan Note (Addendum)
Symptomatic with palpitations, heart rate 120s to 130s, and up to 140s with ambulation.  Recent diagnosis 5/20-spontaneously converted to sinus rhythm in ED.  Has since been compliant with Eliquis, was previously on carvedilol.   - Has not been able to establish care with cardiology as outpatient, will consult cardiology -Recent echo 5/31 EF 60 to 65%, G1DD - IV Cardizem 10 mg bolus given, continue Cardizem drip -Mild hypomagnesemia, repleted -TSH and troponin x2 - WNL -Resume Eliquis -Resume home carvedilol at increased dose 12.'5mg'$  BID

## 2022-03-20 NOTE — ED Provider Notes (Signed)
[facility] Provider Note   CSN: [id number] Arrival date & time: 03/20/22  1133     History  Chief Complaint  Patient presents with   Palpitations    Jennifer Morrison is a 76 y.o. female.  HPI  76 year old female with recently diagnosed atrial fibrillation that spontaneously converted while being evaluated in the emergency department presents to the emergency department with concern for palpitations.  Patient was placed on carvedilol and Eliquis.  She has been compliant with his medication, took her morning medications.  States today she was feeling fatigue/palpitations and her watch told her that she was back in atrial fibrillation.  She was post to follow-up with the atrial fibrillation clinic but has not had any outpatient follow-up established.  Denies any chest pain, back pain.  No swelling of her lower extremities.  No recent fever, cough or illness.  Home Medications Prior to Admission medications   Medication Sig Start Date End Date Taking? Authorizing Provider  ACCU-CHEK AVIVA PLUS test strip 1 each by Other route daily.  06/29/16   [provider]  ACCU-CHEK FASTCLIX LANCETS MISC 1 each by Other route daily.  07/27/16   [provider]  amLODipine (NORVASC) 5 MG tablet Take 5 mg by mouth daily.    [provider]  APIXABAN Arne Cleveland) VTE STARTER PACK ('10MG'$  AND '5MG'$ ) Take as directed on package: start with two-'5mg'$  tablets twice daily for 7 days. On day 8, switch to one-'5mg'$  tablet twice daily. 02/26/22   Myna Bright M, PA-C  clobetasol cream (TEMOVATE) 4.13 % Apply 1 application topically 2 (two) times daily as needed (eczema).    [provider]  ketoconazole (NIZORAL) 2 % shampoo Apply 1 application topically See admin instructions. apply three times per week, massage into scalp and leave in for 10 minutes before rinsing out 11/11/20   Moye, [place], MD  metFORMIN (GLUCOPHAGE) 500 MG tablet Take 500 mg by mouth 2 (two)  times daily.  07/07/16   [provider]  mupirocin ointment (BACTROBAN) 2 % Apply 1 application topically 2 (two) times daily. Apply to wound until healed Patient not taking: Reported on 06/24/2021 11/11/20   Laurence Ferrari, [place], MD  omeprazole (PRILOSEC) 20 MG capsule Take 20 mg by mouth daily.    [provider]  pravastatin (PRAVACHOL) 20 MG tablet Take 20 mg by mouth every evening.     [provider]  valsartan-hydrochlorothiazide (DIOVAN-HCT) 160-25 MG tablet Take 1 tablet by mouth daily.    [provider]      Allergies    Patient has no known allergies.    Review of Systems   Review of Systems  Constitutional:  Positive for fatigue. Negative for fever.  Respiratory:  Negative for shortness of breath.   Cardiovascular:  Positive for palpitations. Negative for chest pain and leg swelling.  Gastrointestinal:  Negative for abdominal pain, diarrhea and vomiting.  Skin:  Negative for rash.  Neurological:  Negative for headaches.    Physical Exam Updated Vital Signs BP (!) 145/75   Pulse (!) 115   Temp 98 F (36.7 C) (Oral)   Resp 16   Ht '5\' 2"'$  (1.575 m)   Wt 78 kg   SpO2 99%   BMI 31.45 kg/m  Physical Exam Vitals and nursing note reviewed.  Constitutional:      General: She is not in acute distress.    Appearance: Normal appearance. She is not ill-appearing.  HENT:     Head: Normocephalic.  Mouth/Throat:     Mouth: Mucous membranes are moist.  Cardiovascular:     Rate and Rhythm: Tachycardia present. Rhythm irregular.  Pulmonary:     Effort: Pulmonary effort is normal. No respiratory distress.  Abdominal:     Palpations: Abdomen is soft.     Tenderness: There is no abdominal tenderness.  Musculoskeletal:        General: No swelling.  Skin:    General: Skin is warm.  Neurological:     Mental Status: She is alert and oriented to person, place, and time. Mental status is at baseline.  Psychiatric:        Mood and Affect: Mood  normal.     ED Results / Procedures / Treatments   Labs (all labs ordered are listed, but only abnormal results are displayed) Labs Reviewed  CBC WITH DIFFERENTIAL/PLATELET - Abnormal; Notable for the following components:      Result Value   WBC 11.6 (*)    Neutro Abs 9.0 (*)    All other components within normal limits  COMPREHENSIVE METABOLIC PANEL - Abnormal; Notable for the following components:   Sodium 134 (*)    Glucose, Bld 170 (*)    BUN 24 (*)    Creatinine, Ser 1.26 (*)    GFR, Estimated 45 (*)    All other components within normal limits  BRAIN NATRIURETIC PEPTIDE - Abnormal; Notable for the following components:   B Natriuretic Peptide 210.0 (*)    All other components within normal limits  MAGNESIUM - Abnormal; Notable for the following components:   Magnesium 1.5 (*)    All other components within normal limits  URINALYSIS, ROUTINE W REFLEX MICROSCOPIC - Abnormal; Notable for the following components:   Color, Urine COLORLESS (*)    Specific Gravity, Urine 1.002 (*)    All other components within normal limits  TSH  TROPONIN I (HIGH SENSITIVITY)    EKG EKG Interpretation  Date/Time:  Sunday March 20 2022 11:44:07 EDT Ventricular Rate:  134 PR Interval:    QRS Duration: 73 QT Interval:  320 QTC Calculation: 478 R Axis:   -8 Text Interpretation: Atrial fibrillation Probable LVH with secondary repol abnrm Confirmed by Lavenia Atlas ([id number]) on 03/20/2022 12:48:01 PM  Radiology No results found.  Procedures .Critical Care  Performed by: Lorelle Gibbs, DO Authorized by: Lorelle Gibbs, DO   Critical care provider statement:    Critical care time (minutes):  30   Critical care time was exclusive of:  Separately billable procedures and treating other patients   Critical care was necessary to treat or prevent imminent or life-threatening deterioration of the following conditions:  Cardiac failure and circulatory failure   Critical care was time  spent personally by me on the following activities:  Development of treatment plan with patient or surrogate, discussions with consultants, evaluation of patient's response to treatment, examination of patient, ordering and review of laboratory studies, ordering and review of radiographic studies, ordering and performing treatments and interventions, pulse oximetry, re-evaluation of patient's condition and review of old charts   I assumed direction of critical care for this patient from another provider in my specialty: no     Care discussed with: admitting provider       Medications Ordered in ED Medications  magnesium sulfate IVPB 1 g 100 mL (has no administration in time range)  sodium chloride 0.9 % bolus 500 mL (500 mLs Intravenous New Bag/Given 03/20/22 1257)  diltiazem (CARDIZEM) injection 10 mg (10  mg Intravenous Given 03/20/22 1240)    ED Course/ Medical Decision Making/ A&P                           Medical Decision Making Amount and/or Complexity of Data Reviewed Labs: ordered. Radiology: ordered.  Risk Prescription drug management. Decision regarding hospitalization.   76 year old female presents emergency department palpitations.  Recent evaluation in the ER for new onset atrial fibrillation, appeared to convert back to sinus, was placed on Coreg and anticoagulation and plan for outpatient follow-up.  Returns today with palpitations, fatigue.  EKG on arrival shows atrial fibrillation, RVR, stable blood pressure.  No active chest pain.  Blood work shows mild dehydration, mild hypomagnesia.  BNP slightly elevated at 200, no CHF findings on x-ray.  Recent echo as an outpatient showed EF greater than 60%.  Patient was given IV hydration, magnesium was replaced, she got IV and p.o. rate control medication.  However she continues to be tachycardic over 100 at rest, with ambulation becomes symptomatic with heart rates in the 130/140.  Patient will require rate control infusion,  admission, cardiology evaluation.  Patients evaluation and results requires admission for further treatment and care.  Spoke with hospitalist, reviewed patient's ED course and they accept admission.  Patient agrees with admission plan, offers no new complaints and is stable/unchanged at time of admit.        Final Clinical Impression(s) / ED Diagnoses Final diagnoses:  None    Rx / DC Orders ED Discharge Orders     None         Lorelle Gibbs, DO 03/20/22 1614

## 2022-03-21 ENCOUNTER — Telehealth: Payer: Self-pay | Admitting: Internal Medicine

## 2022-03-21 ENCOUNTER — Telehealth: Payer: Self-pay

## 2022-03-21 DIAGNOSIS — E119 Type 2 diabetes mellitus without complications: Secondary | ICD-10-CM | POA: Diagnosis not present

## 2022-03-21 DIAGNOSIS — I5030 Unspecified diastolic (congestive) heart failure: Secondary | ICD-10-CM

## 2022-03-21 DIAGNOSIS — E66811 Obesity, class 1: Secondary | ICD-10-CM | POA: Diagnosis present

## 2022-03-21 DIAGNOSIS — I4891 Unspecified atrial fibrillation: Secondary | ICD-10-CM | POA: Diagnosis not present

## 2022-03-21 DIAGNOSIS — C541 Malignant neoplasm of endometrium: Secondary | ICD-10-CM

## 2022-03-21 DIAGNOSIS — K219 Gastro-esophageal reflux disease without esophagitis: Secondary | ICD-10-CM | POA: Diagnosis present

## 2022-03-21 DIAGNOSIS — E669 Obesity, unspecified: Secondary | ICD-10-CM | POA: Diagnosis present

## 2022-03-21 DIAGNOSIS — I10 Essential (primary) hypertension: Secondary | ICD-10-CM | POA: Diagnosis not present

## 2022-03-21 LAB — BASIC METABOLIC PANEL
Anion gap: 7 (ref 5–15)
BUN: 22 mg/dL (ref 8–23)
CO2: 25 mmol/L (ref 22–32)
Calcium: 9.1 mg/dL (ref 8.9–10.3)
Chloride: 105 mmol/L (ref 98–111)
Creatinine, Ser: 1.2 mg/dL — ABNORMAL HIGH (ref 0.44–1.00)
GFR, Estimated: 48 mL/min — ABNORMAL LOW (ref >60)
Glucose, Bld: 160 mg/dL — ABNORMAL HIGH (ref 70–99)
Potassium: 3.7 mmol/L (ref 3.5–5.1)
Sodium: 137 mmol/L (ref 135–145)

## 2022-03-21 LAB — HEMOGLOBIN A1C
Hgb A1c MFr Bld: 6.8 % — ABNORMAL HIGH (ref 4.8–5.6)
Mean Plasma Glucose: 148.46 mg/dL

## 2022-03-21 LAB — MAGNESIUM: Magnesium: 2.2 mg/dL (ref 1.7–2.4)

## 2022-03-21 LAB — CBC
HCT: 37.8 % (ref 36.0–46.0)
Hemoglobin: 12.9 g/dL (ref 12.0–15.0)
MCH: 31.4 pg (ref 26.0–34.0)
MCHC: 34.1 g/dL (ref 30.0–36.0)
MCV: 92 fL (ref 80.0–100.0)
Platelets: 379 10*3/uL (ref 150–400)
RBC: 4.11 MIL/uL (ref 3.87–5.11)
RDW: 12.3 % (ref 11.5–15.5)
WBC: 10.6 10*3/uL — ABNORMAL HIGH (ref 4.0–10.5)
nRBC: 0 % (ref 0.0–0.2)

## 2022-03-21 LAB — GLUCOSE, CAPILLARY
Glucose-Capillary: 171 mg/dL — ABNORMAL HIGH (ref 70–99)
Glucose-Capillary: 125 mg/dL — ABNORMAL HIGH (ref 70–99)

## 2022-03-21 MED ORDER — FUROSEMIDE 20 MG PO TABS
20.0000 mg | ORAL_TABLET | Freq: Every day | ORAL | Status: DC
  Filled 2022-03-21: qty 1

## 2022-03-21 MED ORDER — POTASSIUM CHLORIDE CRYS ER 20 MEQ PO TBCR
30.0000 meq | EXTENDED_RELEASE_TABLET | Freq: Once | ORAL | Status: AC
  Administered 2022-03-21: 30 meq via ORAL
  Filled 2022-03-21: qty 1

## 2022-03-21 MED ORDER — IRBESARTAN 150 MG PO TABS: 150.0000 mg | ORAL_TABLET | Freq: Every day | ORAL | Status: DC

## 2022-03-21 MED ORDER — IRBESARTAN 75 MG PO TABS
75.0000 mg | ORAL_TABLET | Freq: Every day | ORAL | Status: DC
  Administered 2022-03-21: 75 mg via ORAL
  Filled 2022-03-21: qty 1

## 2022-03-21 MED ORDER — METOPROLOL TARTRATE 50 MG PO TABS
50.0000 mg | ORAL_TABLET | Freq: Two times a day (BID) | ORAL | Status: DC
Start: 2022-03-21 — End: 2022-03-21

## 2022-03-21 MED ORDER — METOPROLOL TARTRATE 25 MG PO TABS: 25.0000 mg | ORAL_TABLET | Freq: Two times a day (BID) | ORAL | 2 refills | Status: DC

## 2022-03-21 MED ORDER — DILTIAZEM HCL ER COATED BEADS 120 MG PO CP24
120.0000 mg | ORAL_CAPSULE | Freq: Every day | ORAL | Status: DC
  Administered 2022-03-21: 120 mg via ORAL
  Filled 2022-03-21: qty 1

## 2022-03-21 MED ORDER — DILTIAZEM HCL ER COATED BEADS 120 MG PO CP24: 120.0000 mg | ORAL_CAPSULE | Freq: Every day | ORAL | 2 refills | Status: DC

## 2022-03-21 MED ORDER — IRBESARTAN 75 MG PO TABS: 75.0000 mg | ORAL_TABLET | Freq: Every day | ORAL | 2 refills | Status: DC

## 2022-03-21 MED ORDER — MELATONIN 3 MG PO TABS
6.0000 mg | ORAL_TABLET | Freq: Every evening | ORAL | Status: DC | PRN
  Administered 2022-03-21: 6 mg via ORAL
  Filled 2022-03-21: qty 2

## 2022-03-21 MED ORDER — METOPROLOL TARTRATE 25 MG PO TABS
25.0000 mg | ORAL_TABLET | Freq: Two times a day (BID) | ORAL | Status: DC
  Administered 2022-03-21: 25 mg via ORAL
  Filled 2022-03-21: qty 1

## 2022-03-21 NOTE — Consult Note (Signed)
Cardiology Consultation:   Patient ID: Jennifer Morrison MRN: [id number]; DOB: March 20, 1946  Admit date: 03/20/2022 Date of Consult: 03/21/2022  PCP:  Jennifer Noble, MD   [facility] Providers Cardiologist:  None        Patient Profile:   Jennifer Morrison is a 76 y.o. female with a hx of PAF diagnosed 02/2022 who is being seen 03/21/2022 for the evaluation of Afib with RVR at the request of Jennifer Morrison.  History of Present Illness:   Jennifer Morrison is a 76 yo female with history of HTN & DM. She had palpitations 08/2021 and monitor showed 21 episodes of SVT and started on coreg by PCP. She was in ED 02/26/22 with AFib when her apple watch alerted her. She converded to NSR spontaneously. She was already on carvedilol and started on eliquis with plans to f/u with cardiology.   Patient returned yest with Afib with RVR 120-140's. Echo 03/09/22 normal LVEF 60-65% G1DD. Mg 1.5-replaced, Crt 1.2, BNP 210, trop negative, TSH normal. Meds adjusted this am and started on metoprolol, diltiazem this am. Her husband died in 01/26/23 and she hasn't been sleeping-no sleep apnea. Was walking on treadmill 2-3 miles 5 days/week but stopped last November when this all started.   Past Medical History:  Diagnosis Date   Actinic keratosis 08/23/2018   L lat calf - bx proven   Actinic keratosis 06/24/2021   R med thigh - bx proven   Basal cell carcinoma 09/12/2019   R nasal facial angle near eyelid   Diabetes mellitus without complication (HCC)    NIDDM x 1 yr   GERD (gastroesophageal reflux disease)    Hypercholesteremia    Hypertension    Uterine cancer ([place]) 08/14/2016    Past Surgical History:  Procedure Laterality Date   ABDOMINAL HYSTERECTOMY     CATARACT EXTRACTION W/PHACO Right 06/04/2018   Procedure: CATARACT EXTRACTION PHACO AND INTRAOCULAR LENS PLACEMENT RIGHT EYE CDE=7.64;  Surgeon: Jennifer Branch, MD;  Location: [facility];  Service: Ophthalmology;  Laterality: Right;  right   CATARACT EXTRACTION W/PHACO Left  07/09/2018   Procedure: CATARACT EXTRACTION PHACO AND INTRAOCULAR LENS PLACEMENT (IOC);  Surgeon: Jennifer Branch, MD;  Location: [facility];  Service: Ophthalmology;  Laterality: Left;  CDE: 5.53   COLONOSCOPY  2005   COLONOSCOPY N/A 11/28/2013   Procedure: COLONOSCOPY;  Surgeon: Jennifer Houston, MD;  Location: [facility];  Service: Endoscopy;  Laterality: N/A;  830   Polyp removed from uterus     ROBOTIC ASSISTED LAP VAGINAL HYSTERECTOMY N/A 09/06/2016   Procedure: XI ROBOTIC ASSISTED LAPAROSCOPIC TOTAL HYSTERECTOMY;  Surgeon: Jennifer Amber, MD;  Location: [facility];  Service: Gynecology;  Laterality: N/A;   ROBOTIC ASSISTED SALPINGO OOPHERECTOMY Bilateral 09/06/2016   Procedure: XI ROBOTIC ASSISTED SALPINGO OOPHORECTOMY;  Surgeon: Jennifer Amber, MD;  Location: [facility];  Service: Gynecology;  Laterality: Bilateral;   SENTINEL NODE BIOPSY N/A 09/06/2016   Procedure: SENTINEL NODE BIOPSY;  Surgeon: Jennifer Amber, MD;  Location: [facility];  Service: Gynecology;  Laterality: N/A;     Home Medications:  Prior to Admission medications   Medication Sig Start Date End Date Taking? Authorizing Provider  amLODipine (NORVASC) 5 MG tablet Take 5 mg by mouth daily.   Yes [provider]  APIXABAN Arne [place]) VTE STARTER PACK ('10MG'$  AND '5MG'$ ) Take as directed on package: start with two-'5mg'$  tablets twice daily for 7 days. On day 8, switch to one-'5mg'$  tablet twice daily. Patient taking differently: Take 5 mg by mouth  2 (two) times daily. 02/26/22  Yes Jennifer Morrison, Jennifer Morrison  carvedilol (COREG) 6.25 MG tablet Take 6.25 mg by mouth 2 (two) times daily. 01/03/22  Yes [provider]  metFORMIN (GLUCOPHAGE) 500 MG tablet Take 500 mg by mouth 2 (two) times daily.  07/07/16  Yes [provider]  omeprazole (PRILOSEC) 20 MG capsule Take 20 mg by mouth daily.   Yes [provider]  pravastatin (PRAVACHOL) 20 MG tablet Take 20 mg by mouth every evening.    Yes [provider]   valsartan-hydrochlorothiazide (DIOVAN-HCT) 160-25 MG tablet Take 1 tablet by mouth daily.   Yes [provider]    Inpatient Medications: Scheduled Meds:  apixaban  5 mg Oral BID   Chlorhexidine Gluconate Cloth  6 each Topical Q0600   diltiazem  120 mg Oral Daily   furosemide  20 mg Oral Daily   insulin aspart  0-5 Units Subcutaneous QHS   insulin aspart  0-9 Units Subcutaneous TID WC   irbesartan  75 mg Oral Daily   metoprolol tartrate  25 mg Oral BID   pantoprazole  40 mg Oral Daily   potassium chloride  30 mEq Oral Once   pravastatin  20 mg Oral QPM   Continuous Infusions:  diltiazem (CARDIZEM) infusion 5 mg/hr (03/21/22 0729)   PRN Meds: acetaminophen **OR** acetaminophen, melatonin, ondansetron **OR** ondansetron (ZOFRAN) IV, polyethylene glycol  Allergies:   No Known Allergies  Social History:   Social History   Socioeconomic History   Marital status: Widowed    Spouse name: Jennifer Morrison   Number of children: Not on file   Years of education: Not on file   Highest education level: Not on file  Occupational History   Not on file  Tobacco Use   Smoking status: Never   Smokeless tobacco: Never  Vaping Use   Vaping Use: Never used  Substance and Sexual Activity   Alcohol use: No   Drug use: No   Sexual activity: Yes  Other Topics Concern   Not on file  Social History Narrative   Not on file   Social Determinants of Health   Financial Resource Strain: Not on file  Food Insecurity: Not on file  Transportation Needs: Not on file  Physical Activity: Not on file  Stress: Not on file  Social Connections: Not on file  Intimate Partner Violence: Not on file    Family History:     Family History  Problem Relation Age of Onset   Diabetes Father    Diabetes Brother    Colon cancer Neg Hx      ROS:  Please see the history of present illness.  Review of Systems  Constitutional: Negative.  HENT: Negative.    Eyes: Negative.   Cardiovascular:  Negative.   Respiratory: Negative.    Hematologic/Lymphatic: Negative.   Musculoskeletal: Negative.  Negative for joint pain.  Gastrointestinal: Negative.   Genitourinary: Negative.   Neurological: Negative.     All other ROS reviewed and negative.     Physical Exam/Data:   Vitals:   03/21/22 0700 03/21/22 0717 03/21/22 0730 03/21/22 0800  BP: (!) 146/46   (!) 144/58  Pulse: 86 79 86 94  Resp: '16 19 14 '$ (!) 22  Temp:  98.7 F (37.1 C)    TempSrc:  Oral    SpO2: 99% 98% 98% 98%  Weight:      Height:        Intake/Output Summary (Last 24 hours) at 03/21/2022 [phone]  Last data filed at 03/21/2022 0729 Gross per 24 hour  Intake 697.02 ml  Output --  Net 697.02 ml      03/20/2022    5:42 PM 03/20/2022   11:47 AM 02/26/2022   11:11 AM  Last 3 Weights  Weight (lbs) 173 lb 11.6 oz 171 lb 15.3 oz 172 lb  Weight (kg) 78.8 kg 78 kg 78.019 kg     Body mass index is 31.77 kg/m.  General:  Well nourished, well developed, in no acute distress  HEENT: normal Neck: no JVD Vascular: No carotid bruits; Distal pulses 2+ bilaterally Cardiac:  normal S1, S2; RRR; no murmur   Lungs:  clear to auscultation bilaterally, no wheezing, rhonchi or rales  Abd: soft, nontender, no hepatomegaly  Ext: no edema Musculoskeletal:  No deformities, BUE and BLE strength normal and equal Skin: warm and dry  Neuro:  CNs 2-12 intact, no focal abnormalities noted Psych:  Normal affect   EKG:  The EKG was personally reviewed and demonstrates:  Afib 134/m with LVH Telemetry:  Telemetry was personally reviewed and demonstrates:  Afib 130/m this am but now rate controlled.   Relevant CV Studies:   IMPRESSIONS     1. Left ventricular ejection fraction, by estimation, is 60 to 65%. The  left ventricle has normal function. The left ventricle has no regional  wall motion abnormalities. Left ventricular diastolic parameters are  consistent with Grade I diastolic  dysfunction (impaired relaxation).   2. Right  ventricular systolic function is normal. The right ventricular  size is normal. There is mildly elevated pulmonary artery systolic  pressure.   3. The mitral valve is normal in structure. No evidence of mitral valve  regurgitation. No evidence of mitral stenosis.   4. The tricuspid valve is abnormal.   5. The aortic valve is tricuspid. Aortic valve regurgitation is not  visualized. No aortic stenosis is present.   6. The inferior vena cava is normal in size with greater than 50%  respiratory variability, suggesting right atrial pressure of 3 mmHg   Left Atrium: Left atrial size was normal in size.   Right Atrium: Right atrial size was normal in size.   Pericardium: There is no evidence of pericardial effusion.   Laboratory Data:  High Sensitivity Troponin:   Recent Labs  Lab 03/20/22 1224 03/20/22 1603  TROPONINIHS 5 6     Chemistry Recent Labs  Lab 03/20/22 1224 03/21/22 0243  NA 134* 137  K 3.9 3.7  CL 103 105  CO2 22 25  GLUCOSE 170* 160*  BUN 24* 22  CREATININE 1.26* 1.20*  CALCIUM 9.6 9.1  MG 1.5* 2.2  GFRNONAA 45* 48*  ANIONGAP 9 7    Recent Labs  Lab 03/20/22 1224  PROT 7.7  ALBUMIN 3.8  AST 21  ALT 18  ALKPHOS 40  BILITOT 0.9   Lipids No results for input(s): "CHOL", "TRIG", "HDL", "LABVLDL", "LDLCALC", "CHOLHDL" in the last 168 hours.  Hematology Recent Labs  Lab 03/20/22 1224 03/21/22 0243  WBC 11.6* 10.6*  RBC 4.41 4.11  HGB 13.8 12.9  HCT 40.3 37.8  MCV 91.4 92.0  MCH 31.3 31.4  MCHC 34.2 34.1  RDW 12.2 12.3  PLT 394 379   Thyroid  Recent Labs  Lab 03/20/22 1224  TSH 1.923    BNP Recent Labs  Lab 03/20/22 1224  BNP 210.0*    DDimer No results for input(s): "DDIMER" in the last 168 hours.   Radiology/Studies:  DG Chest  Port 1 View  Result Date: 03/20/2022 CLINICAL DATA:  Atrial fibrillation. EXAM: PORTABLE CHEST 1 VIEW COMPARISON:  02/26/2022 FINDINGS: Heart size and mediastinal contours appear within normal limits. No  pleural effusion or edema. No airspace opacities identified. Visualized osseous structures are unremarkable. IMPRESSION: No active disease. Electronically Signed   By: Kerby Moors M.D.   On: 03/20/2022 13:39     Assessment and Plan:    Atrial fibrillation with RVR on Eliquis since 02/26/22-hasn't missed any doses. Rate better controlled this am on IV diltiazem and coreg stopped and started on po diltiazem and metoprolol. Will plan outpatient DCCV with Dr. Debara Pickett on Thurs if she doesn't convert before.   Elevated BNP-mild, asymptomatic and not significant on exam. Started on lasix 20 mg daily and HCTZ stopped. Crt 1.2. will stop lasix  HTN BP controlled  DM2 A1C  6.8  Risk Assessment/Risk Scores:          CHA2DS2-VASc Score = 4   This indicates a 4.8% annual risk of stroke. The patient's score is based upon: CHF History: 0 HTN History: 1 Diabetes History: 1 Stroke History: 0 Vascular Disease History: 0 Age Score: 1 Gender Score: 1         For questions or updates, please contact [place] Please consult [email or web address] for contact info under    Signed, Ermalinda Barrios, Morrison  03/21/2022 8:35 AM

## 2022-03-21 NOTE — Progress Notes (Signed)
0900- PO meds administered   0940- cardizem gtt off  1020- Ambulated with patient around unit 3 times. Heart rate remained below 95. No complaints of SOB, dizziness, or palpitations. Dr. Dyann Kief notified via secure chat.

## 2022-03-21 NOTE — Care Management CC44 (Signed)
Condition Code 44 Documentation Completed  Patient Details  Name: Jennifer Morrison MRN: [id number] Date of Birth: January 05, 1946   Condition Code 44 given:  Yes Patient signature on Condition Code 44 notice:  Yes Documentation of 2 MD's agreement:  Yes Code 44 added to claim:  Yes    Ihor Gully, LCSW 03/21/2022, 12:21 PM

## 2022-03-21 NOTE — Telephone Encounter (Signed)
Checking percert on the following patient for testing scheduled at [facility]     A-fib cardioversion requested by M.Lenze, PA-C.   Pre-op Wednesday, 03/23/22 with EKG at 1:45 pm   Cardioversion Friday, 03/25/22 at 10:30 with Dr.Ross     Pre-cert sent by V.Slaughter

## 2022-03-21 NOTE — Patient Instructions (Signed)
Jennifer Morrison  03/21/2022     '@PREFPERIOPPHARMACY'$ @   Your procedure is scheduled on  03/25/2022.   Report to [facility] at  0900  A.M.   Call this number if you have problems the morning of surgery:  [phone]   Remember:  Do not eat or drink after midnight.      DO NOT miss any doses of eliquis before your procedure.     DO NOT take any medication for diabetes the morning of your procedure.    Take these medicines the morning of surgery with A SIP OF WATER                     amlodipine, diltiazem, prilosec.    Do not wear jewelry, make up or nail polish.   Do not wear lotions, powders, or perfumes, or deodorant.  Do not shave 48 hours prior to surgery.  Men may shave face and neck.  Do not bring valuables to the hospital.  [facility] is not responsible for any belongings or valuables.  Contacts, dentures or bridgework may not be worn into surgery.  Leave your suitcase in the car.  After surgery it may be brought to your room.  For patients admitted to the hospital, discharge time will be determined by your treatment team.  Patients discharged the day of surgery will not be allowed to drive home and must have someone with them for 24 hours.    Special instructions:   DO NOT smoke tobacco or vape for 24 hours before your procedure.  Please read over the following fact sheets that you were given. Anesthesia Post-op Instructions and Care and Recovery After Surgery      Electrical Cardioversion Electrical cardioversion is the delivery of a jolt of electricity to restore a normal rhythm to the heart. A rhythm that is too fast or is not regular keeps the heart from pumping well. In this procedure, sticky patches or metal paddles are placed on the chest to deliver electricity to the heart from a device. This procedure may be done in an emergency if: There is low or no blood pressure as a result of the heart rhythm. Normal rhythm must be restored as fast as  possible to protect the brain and heart from further damage. It may save a life. This may also be a scheduled procedure for irregular or fast heart rhythms that are not immediately life-threatening. Tell a health care provider about: Any allergies you have. All medicines you are taking, including vitamins, herbs, eye drops, creams, and over-the-counter medicines. Any problems you or family members have had with anesthetic medicines. Any blood disorders you have. Any surgeries you have had. Any medical conditions you have. Whether you are pregnant or may be pregnant. What are the risks? Generally, this is a safe procedure. However, problems may occur, including: Allergic reactions to medicines. A blood clot that breaks free and travels to other parts of your body. The possible return of an abnormal heart rhythm within hours or days after the procedure. Your heart stopping (cardiac arrest). This is rare. What happens before the procedure? Medicines Your health care provider may have you start taking: Blood-thinning medicines (anticoagulants) so your blood does not clot as easily. Medicines to help stabilize your heart rate and rhythm. Ask your health care provider about: Changing or stopping your regular medicines. This is especially important if you are taking diabetes medicines or blood thinners. Taking medicines  such as aspirin and ibuprofen. These medicines can thin your blood. Do not take these medicines unless your health care provider tells you to take them. Taking over-the-counter medicines, vitamins, herbs, and supplements. General instructions Follow instructions from your health care provider about eating or drinking restrictions. Plan to have someone take you home from the hospital or clinic. If you will be going home right after the procedure, plan to have someone with you for 24 hours. Ask your health care provider what steps will be taken to help prevent infection. These  may include washing your skin with a germ-killing soap. What happens during the procedure?  An IV will be inserted into one of your veins. Sticky patches (electrodes) or metal paddles may be placed on your chest. You will be given a medicine to help you relax (sedative). An electrical shock will be delivered. The procedure may vary among health care providers and hospitals. What can I expect after the procedure? Your blood pressure, heart rate, breathing rate, and blood oxygen level will be monitored until you leave the hospital or clinic. Your heart rhythm will be watched to make sure it does not change. You may have some redness on the skin where the shocks were given. Follow these instructions at home: Do not drive for 24 hours if you were given a sedative during your procedure. Take over-the-counter and prescription medicines only as told by your health care provider. Ask your health care provider how to check your pulse. Check it often. Rest for 48 hours after the procedure or as told by your health care provider. Avoid or limit your caffeine use as told by your health care provider. Keep all follow-up visits as told by your health care provider. This is important. Contact a health care provider if: You feel like your heart is beating too quickly or your pulse is not regular. You have a serious muscle cramp that does not go away. Get help right away if: You have discomfort in your chest. You are dizzy or you feel faint. You have trouble breathing or you are short of breath. Your speech is slurred. You have trouble moving an arm or leg on one side of your body. Your fingers or toes turn cold or blue. Summary Electrical cardioversion is the delivery of a jolt of electricity to restore a normal rhythm to the heart. This procedure may be done right away in an emergency or may be a scheduled procedure if the condition is not an emergency. Generally, this is a safe procedure. After  the procedure, check your pulse often as told by your health care provider. This information is not intended to replace advice given to you by your health care provider. Make sure you discuss any questions you have with your health care provider. Document Revised: 08/26/2021 Document Reviewed: 04/29/2019 Elsevier Patient Education  [place] After This sheet gives you information about how to care for yourself after your procedure. Your health care provider may also give you more specific instructions. If you have problems or questions, contact your health care provider. What can I expect after the procedure? After the procedure, it is common to have: Tiredness. Forgetfulness about what happened after the procedure. Impaired judgment for important decisions. Nausea or vomiting. Some difficulty with balance. Follow these instructions at home: For the time period you were told by your health care provider:     Rest as needed. Do not participate in activities where you could fall  or become injured. Do not drive or use machinery. Do not drink alcohol. Do not take sleeping pills or medicines that cause drowsiness. Do not make important decisions or sign legal documents. Do not take care of children on your own. Eating and drinking Follow the diet that is recommended by your health care provider. Drink enough fluid to keep your urine pale yellow. If you vomit: Drink water, juice, or soup when you can drink without vomiting. Make sure you have little or no nausea before eating solid foods. General instructions Have a responsible adult stay with you for the time you are told. It is important to have someone help care for you until you are awake and alert. Take over-the-counter and prescription medicines only as told by your health care provider. If you have sleep apnea, surgery and certain medicines can increase your risk for breathing problems.  Follow instructions from your health care provider about wearing your sleep device: Anytime you are sleeping, including during daytime naps. While taking prescription pain medicines, sleeping medicines, or medicines that make you drowsy. Avoid smoking. Keep all follow-up visits as told by your health care provider. This is important. Contact a health care provider if: You keep feeling nauseous or you keep vomiting. You feel light-headed. You are still sleepy or having trouble with balance after 24 hours. You develop a rash. You have a fever. You have redness or swelling around the IV site. Get help right away if: You have trouble breathing. You have new-onset confusion at home. Summary For several hours after your procedure, you may feel tired. You may also be forgetful and have poor judgment. Have a responsible adult stay with you for the time you are told. It is important to have someone help care for you until you are awake and alert. Rest as told. Do not drive or operate machinery. Do not drink alcohol or take sleeping pills. Get help right away if you have trouble breathing, or if you suddenly become confused. This information is not intended to replace advice given to you by your health care provider. Make sure you discuss any questions you have with your health care provider. Document Revised: 08/31/2021 Document Reviewed: 08/29/2019 Elsevier Patient Education  [place].

## 2022-03-21 NOTE — Progress Notes (Addendum)
Discharge paperwork and instructions provided to patient and patient's daughter- placed in packet. PIV removed by Benay Pillow, RN. All belongings returned and patient transported via wheelchair to main entrance with RN.

## 2022-03-21 NOTE — Discharge Summary (Signed)
Physician Discharge Summary   Patient: Jennifer Morrison MRN: [id number] DOB: Nov 01, 1945  Admit date:     03/20/2022  Discharge date: 03/21/22  Discharge Physician: Barton Dubois   PCP: Asencion Noble, MD   Recommendations at discharge:  Repeat basic metabolic panel to follow ultralights renal function Repeat CBC to follow hemoglobin and stability as patient has been initiated on anticoagulation therapy. -Reassess blood pressure and adjust antihypertensive regimen as needed Make sure patient has follow-up with cardiology service as instructed.   Discharge Diagnoses: Principal Problem:   Atrial fibrillation with rapid ventricular response (HCC) Active Problems:   Endometrial cancer (HCC)   DM (diabetes mellitus) ([place])   HTN (hypertension)   Atrial fibrillation with RVR (HCC)   Diastolic heart failure with preserved ejection fraction [facility])   Hospital Course: As per H&P written by Dr. Denton Brick on 03/20/2022 Jennifer Morrison is a 76 y.o. female with medical history significant for  DM, HTN, new atria fib diagnosis. Patient presented to the ED today with complaints of palpitation which she noticed about the time that her wristwatch told that she was in atrial fibrillation.  Patient was in the ED 5/20-presented to the ED after her apple wristwatch told her she was in atrial fibrillation.  She was asymptomatic.  Heart rate was 1 10-1 20.  She converted in the ED to sinus rhythm spontaneously.  Patient was already on carvedilol she reports she was started on anticoagulation with Eliquis.  She had echocardiogram done as an outpatient.  She was to establish care with a cardiologist, but she has not been able to get in and does not have an appointment.   Today other than palpitation, she denies chest pain or difficulty breathing.  No dizziness.  No weakness of extremities.  She has been compliant with both Eliquis and carvedilol.  She has no other symptoms or complaints.   ED Course: Temperature 98.   Heart rate initially 120s to 130s, up to 140s with ambulation.  Blood pressure systolic 601U to 932T. Troponin 5 > 6.  EKG shows atrial fibrillation rate 134.  Magnesium 1.5.  BNP 210.  TSH 1.9. Carvedilol 3.125 mg given in ED, labetalol 10 mg given, with some transient improvement in heart rate.  With persistent tachycardia, 10 mg bolus Cardizem given, and Cardizem drip started.  Assessment and Plan: * Atrial fibrillation with rapid ventricular response (HCC) -Symptomatic with palpitations, heart rate 120s to 130s, and up to 140s with ambulation at time of admission.   -Patient has been recently diagnosed with A-fib with RVR and at that time she converted spontaneously; initiated on Eliquis and asked to follow-up with cardiology as an outpatient (never happened). -Patient carvedilol was increased and she was kept on her amlodipine medication. -After discussing with cardiology service here amlodipine has been discontinued and patient started on Cardizem; carvedilol discontinued and patient started on Lopressor 25 mg twice a day. -Patient will continue the use of Eliquis for secondary prevention as her CHADS2 score is 4-5. -Outpatient follow-up with cardiology as an outpatient has been arranged.   -2D echo review which demonstrated preserved ejection fraction, no wall motion abnormalities and no significant valvular disorder.  Grade 1 diastolic dysfunction appreciated.   Obesity, Class I, BMI 30-34.9 - Low calorie diet, portion control and increase physical activity discussed with patient. -Body mass index is 31.77 kg/m.   Gastroesophageal reflux disease - Continue PPI.  HTN (hypertension) -Stable. -Medications has been adjusted in order to facilitate titration of rate controlling agents  if needed -Cardizem 120 mg extended release daily, metoprolol 25 mg by mouth twice a day and the use of Avapro 75 mg daily, HCTZ has been discontinued.  If needed down the road use adequate dose of  Lasix. -Heart healthy/low-sodium diet discussed with patient.   DM (diabetes mellitus) ([place]) - A1c 6.8 -Resume home hypoglycemic regimen (metformin) -Modified carbohydrate diet and adequate hydration discussed with patient -Continue outpatient follow-up with PCP.  Endometrial cancer ([place]) -Stage IA grade 1 endometrioid endometrial cancer s/p robotic assisted hysterectomy, BSO and staging on 09/06/16.   -No longer under surveillance.   Consultants: Cardiology service Procedures performed: See below for x-ray reports. Disposition: Home Diet recommendation: Modified carbohydrates, heart healthy/low-sodium and low calorie diet.  DISCHARGE MEDICATION: Allergies as of 03/21/2022   No Known Allergies      Medication List     STOP taking these medications    amLODipine 5 MG tablet Commonly known as: NORVASC   carvedilol 6.25 MG tablet Commonly known as: COREG   valsartan-hydrochlorothiazide 160-25 MG tablet Commonly known as: DIOVAN-HCT       TAKE these medications    Apixaban Starter Pack ('10mg'$  and '5mg'$ ) Commonly known as: ELIQUIS STARTER PACK Take as directed on package: start with two-'5mg'$  tablets twice daily for 7 days. On day 8, switch to one-'5mg'$  tablet twice daily. What changed:  how much to take how to take this when to take this additional instructions   diltiazem 120 MG 24 hr capsule Commonly known as: CARDIZEM CD Take 1 capsule (120 mg total) by mouth daily. Start taking on: March 22, 2022   irbesartan 75 MG tablet Commonly known as: AVAPRO Take 1 tablet (75 mg total) by mouth daily. Start taking on: March 22, 2022   metFORMIN 500 MG tablet Commonly known as: GLUCOPHAGE Take 500 mg by mouth 2 (two) times daily.   metoprolol tartrate 25 MG tablet Commonly known as: LOPRESSOR Take 1 tablet (25 mg total) by mouth 2 (two) times daily.   omeprazole 20 MG capsule Commonly known as: PRILOSEC Take 20 mg by mouth daily.   pravastatin 20 MG  tablet Commonly known as: PRAVACHOL Take 20 mg by mouth every evening.        Follow-up Information     Asencion Noble, MD. Schedule an appointment as soon as possible for a visit in 2 week(s).   Specialty: Internal Medicine Contact information: [address] [place] [address] [phone]                Discharge Exam: Danley Danker Weights   03/20/22 1147 03/20/22 1742  Weight: 78 kg 78.8 kg   General exam: Alert, awake, oriented x 3; no chest pain, no nausea, no vomiting, reporting no palpitations or shortness of breath currently.  Feeling ready to go home. Respiratory system: Clear to auscultation. Respiratory effort normal.  Good saturation on room air. Cardiovascular system: Irregular, rate controlled, no rubs, no gallops, no JVD, no murmur. Gastrointestinal system: Abdomen is nondistended, soft and nontender. No organomegaly or masses felt. Normal bowel sounds heard. Central nervous system: Alert and oriented. No focal neurological deficits. Extremities: No cyanosis, clubbing or edema on exam. Skin: No petechiae. Psychiatry: Judgement and insight appear normal. Mood & affect appropriate.    Condition at discharge: Stable and improved.  The results of significant diagnostics from this hospitalization (including imaging, microbiology, ancillary and laboratory) are listed below for reference.   Imaging Studies: DG Chest Port 1 View  Result Date: 03/20/2022 CLINICAL DATA:  Atrial fibrillation. EXAM: PORTABLE CHEST 1 VIEW COMPARISON:  02/26/2022 FINDINGS: Heart size and mediastinal contours appear within normal limits. No pleural effusion or edema. No airspace opacities identified. Visualized osseous structures are unremarkable. IMPRESSION: No active disease. Electronically Signed   By: Kerby Moors M.D.   On: 03/20/2022 13:39   ECHOCARDIOGRAM COMPLETE  Result Date: 03/09/2022    ECHOCARDIOGRAM REPORT   Patient Name:   Jennifer Morrison Date of Exam: 03/09/2022  Medical Rec #:  [id number]      Height:       62.0 in Accession #:    [id number]     Weight:       172.0 lb Date of Birth:  [birth year]      BSA:          1.793 m Patient Age:    60 years       BP:           150/75 mmHg Patient Gender: F              HR:           73 bpm. Exam Location:  [facility] Procedure: 2D Echo, Cardiac Doppler and Color Doppler Indications:    I48.91 (ICD-10-CM) - Atrial fibrillation, unspecified type  History:        Patient has no prior history of Echocardiogram examinations.                 Risk Factors:Hypertension, Diabetes and Dyslipidemia. Hx of                 COVID 19.  Sonographer:    Alvino Chapel RCS Referring Phys: [place]  1. Left ventricular ejection fraction, by estimation, is 60 to 65%. The left ventricle has normal function. The left ventricle has no regional wall motion abnormalities. Left ventricular diastolic parameters are consistent with Grade I diastolic dysfunction (impaired relaxation).  2. Right ventricular systolic function is normal. The right ventricular size is normal. There is mildly elevated pulmonary artery systolic pressure.  3. The mitral valve is normal in structure. No evidence of mitral valve regurgitation. No evidence of mitral stenosis.  4. The tricuspid valve is abnormal.  5. The aortic valve is tricuspid. Aortic valve regurgitation is not visualized. No aortic stenosis is present.  6. The inferior vena cava is normal in size with greater than 50% respiratory variability, suggesting right atrial pressure of 3 mmHg. FINDINGS  Left Ventricle: Left ventricular ejection fraction, by estimation, is 60 to 65%. The left ventricle has normal function. The left ventricle has no regional wall motion abnormalities. The left ventricular internal cavity size was normal in size. There is  no left ventricular hypertrophy. Left ventricular diastolic parameters are consistent with Grade I diastolic dysfunction (impaired relaxation). Normal left  ventricular filling pressure. Right Ventricle: The right ventricular size is normal. Right vetricular wall thickness was not well visualized. Right ventricular systolic function is normal. There is mildly elevated pulmonary artery systolic pressure. The tricuspid regurgitant velocity  is 3.09 m/s, and with an assumed right atrial pressure of 3 mmHg, the estimated right ventricular systolic pressure is 63.8 mmHg. Left Atrium: Left atrial size was normal in size. Right Atrium: Right atrial size was normal in size. Pericardium: There is no evidence of pericardial effusion. Mitral Valve: The mitral valve is normal in structure. No evidence of mitral valve regurgitation. No evidence of mitral valve stenosis. Tricuspid Valve: The tricuspid valve is abnormal. Tricuspid valve regurgitation is  mild . No evidence of tricuspid stenosis. Aortic Valve: The aortic valve is tricuspid. Aortic valve regurgitation is not visualized. No aortic stenosis is present. Aortic valve mean gradient measures 4.8 mmHg. Aortic valve peak gradient measures 9.4 mmHg. Pulmonic Valve: The pulmonic valve was not well visualized. Pulmonic valve regurgitation is not visualized. No evidence of pulmonic stenosis. Aorta: The aortic root is normal in size and structure. Venous: The inferior vena cava is normal in size with greater than 50% respiratory variability, suggesting right atrial pressure of 3 mmHg. IAS/Shunts: No atrial level shunt detected by color flow Doppler.  LEFT VENTRICLE PLAX 2D LVIDd:         4.70 cm Diastology LVIDs:         2.60 cm LV e' medial:    8.05 cm/s LV PW:         1.00 cm LV E/e' medial:  10.6 LV IVS:        0.80 cm LV e' lateral:   10.40 cm/s                        LV E/e' lateral: 8.2  RIGHT VENTRICLE RV S prime:     15.30 cm/s TAPSE (M-mode): 2.1 cm LEFT ATRIUM             Index        RIGHT ATRIUM           Index LA diam:        3.60 cm 2.01 cm/m   RA Area:     16.40 cm LA Vol (A2C):   42.3 ml 23.59 ml/m  RA Volume:    45.10 ml  25.15 ml/m LA Vol (A4C):   57.9 ml 32.29 ml/m LA Biplane Vol: 49.5 ml 27.61 ml/m  AORTIC VALVE AV Vmax:           152.98 cm/s AV Vmean:          103.437 cm/s AV VTI:            0.342 m AV Peak Grad:      9.4 mmHg AV Mean Grad:      4.8 mmHg LVOT Vmax:         103.00 cm/s LVOT Vmean:        63.200 cm/s LVOT VTI:          0.259 m LVOT/AV VTI ratio: 0.76  AORTA Ao Root diam: 3.10 cm MITRAL VALVE                TRICUSPID VALVE MV Area (PHT): 4.21 cm     TR Peak grad:   38.2 mmHg MV Decel Time: 180 msec     TR Vmax:        309.00 cm/s MV E velocity: 85.60 cm/s MV A velocity: 101.00 cm/s  SHUNTS MV E/A ratio:  0.85         Systemic VTI: 0.26 m Carlyle Dolly MD Electronically signed by Carlyle Dolly MD Signature Date/Time: 03/09/2022/1:59:16 PM    Final    DG Chest 2 View  Result Date: 02/26/2022 CLINICAL DATA:  Tachycardia.  Atrial fibrillation. EXAM: CHEST - 2 VIEW COMPARISON:  08/30/2016 FINDINGS: The heart size and mediastinal contours are within normal limits. Both lungs are clear. The visualized skeletal structures are unremarkable. IMPRESSION: No active cardiopulmonary disease. Electronically Signed   By: Marlaine Hind M.D.   On: 02/26/2022 11:31    Microbiology: Results for orders placed or performed during the  hospital encounter of 03/20/22  MRSA Next Gen by PCR, Nasal     Status: None   Collection Time: 03/20/22  5:42 PM   Specimen: Nasal Mucosa; Nasal Swab  Result Value Ref Range Status   MRSA by PCR Next Gen NOT DETECTED NOT DETECTED Final    Comment: (NOTE) The GeneXpert MRSA Assay (FDA approved for NASAL specimens only), is one component of a comprehensive MRSA colonization surveillance program. It is not intended to diagnose MRSA infection nor to guide or monitor treatment for MRSA infections. Test performance is not FDA approved in patients less than 52 years old. Performed at [facility], [address], [place], [place] [address]     Labs: CBC: Recent Labs  Lab  03/20/22 1224 03/21/22 0243  WBC 11.6* 10.6*  NEUTROABS 9.0*  --   HGB 13.8 12.9  HCT 40.3 37.8  MCV 91.4 92.0  PLT 394 973   Basic Metabolic Panel: Recent Labs  Lab 03/20/22 1224 03/21/22 0243  NA 134* 137  K 3.9 3.7  CL 103 105  CO2 22 25  GLUCOSE 170* 160*  BUN 24* 22  CREATININE 1.26* 1.20*  CALCIUM 9.6 9.1  MG 1.5* 2.2   Liver Function Tests: Recent Labs  Lab 03/20/22 1224  AST 21  ALT 18  ALKPHOS 40  BILITOT 0.9  PROT 7.7  ALBUMIN 3.8   CBG: Recent Labs  Lab 03/20/22 2129 03/21/22 0722 03/21/22 1137  GLUCAP 135* 171* 125*    Discharge time spent: greater than 30 minutes.  Signed: Barton Dubois, MD [facility] 03/21/2022

## 2022-03-21 NOTE — Care Management Obs Status (Signed)
[place] NOTIFICATION   Patient Details  Name: Jennifer Morrison MRN: [id number] Date of Birth: June 13, 1946   Medicare Observation Status Notification Given:  Yes    Ihor Gully, LCSW 03/21/2022, 12:21 PM

## 2022-03-21 NOTE — Assessment & Plan Note (Signed)
Continue PPI ?

## 2022-03-21 NOTE — Telephone Encounter (Signed)
A-fib cardioversion requested by M.Lenze, PA-C.  Pre-op Wednesday, 03/23/22 with EKG at 1:45 pm  Cardioversion Friday, 03/25/22 at 10:30 with Dr.Ross   Pre-cert sent by V.Slaughter

## 2022-03-21 NOTE — Assessment & Plan Note (Signed)
-   Low calorie diet, portion control and increase physical activity discussed with patient. -Body mass index is 31.77 kg/m.

## 2022-03-22 ENCOUNTER — Ambulatory Visit (HOSPITAL_COMMUNITY)
Admission: RE | Admit: 2022-03-22 | Discharge: 2022-03-22 | Disposition: A | Discharge status: Home / Self Care | Payer: Medicare Other | Source: Ambulatory Visit | Attending: Internal Medicine | Admitting: Internal Medicine

## 2022-03-22 DIAGNOSIS — N6310 Unspecified lump in the right breast, unspecified quadrant: Secondary | ICD-10-CM | POA: Insufficient documentation

## 2022-03-22 DIAGNOSIS — R928 Other abnormal and inconclusive findings on diagnostic imaging of breast: Secondary | ICD-10-CM | POA: Diagnosis not present

## 2022-03-22 DIAGNOSIS — Z1231 Encounter for screening mammogram for malignant neoplasm of breast: Secondary | ICD-10-CM | POA: Diagnosis not present

## 2022-03-22 DIAGNOSIS — N6312 Unspecified lump in the right breast, upper inner quadrant: Secondary | ICD-10-CM | POA: Diagnosis not present

## 2022-03-22 DIAGNOSIS — N6315 Unspecified lump in the right breast, overlapping quadrants: Secondary | ICD-10-CM | POA: Diagnosis not present

## 2022-03-22 DIAGNOSIS — N6311 Unspecified lump in the right breast, upper outer quadrant: Secondary | ICD-10-CM | POA: Diagnosis not present

## 2022-03-23 ENCOUNTER — Encounter (HOSPITAL_COMMUNITY)
Admission: RE | Admit: 2022-03-23 | Discharge: 2022-03-23 | Disposition: A | Discharge status: Home / Self Care | Payer: Medicare Other | Source: Ambulatory Visit | Attending: Internal Medicine | Admitting: Internal Medicine

## 2022-03-23 VITALS — BP 144/49 | HR 65 | Temp 98.0°F | Resp 18 | Ht 62.0 in | Wt 173.7 lb

## 2022-03-23 DIAGNOSIS — E119 Type 2 diabetes mellitus without complications: Secondary | ICD-10-CM

## 2022-03-23 DIAGNOSIS — I4891 Unspecified atrial fibrillation: Secondary | ICD-10-CM | POA: Diagnosis not present

## 2022-03-23 DIAGNOSIS — Z01810 Encounter for preprocedural cardiovascular examination: Secondary | ICD-10-CM | POA: Diagnosis not present

## 2022-03-23 DIAGNOSIS — I10 Essential (primary) hypertension: Secondary | ICD-10-CM

## 2022-03-23 NOTE — Progress Notes (Signed)
Patient arrived for pre-admission testing prior to cardioversion on 03/25/2022 by Dr Harrington Challenger. Patient noted on her Apple watch that her heart was in NSR.  EKG obtained and showed NSR.   Dr. Harrington Challenger' office notified and PA reviewed and confirmed NSR.  Cardioversion cancelled for 03/25/2022.  Patient to follow-up with appointment 04/27/2022 @ 2:30 pm with Stormy Fabian PA.  Call if A Fib symptoms return or A Fib is picked up on Apple watch.

## 2022-03-25 ENCOUNTER — Ambulatory Visit (HOSPITAL_COMMUNITY): Admission: RE | Admit: 2022-03-25 | Payer: Medicare Other | Source: Home / Self Care | Admitting: Internal Medicine

## 2022-03-25 ENCOUNTER — Encounter (HOSPITAL_COMMUNITY): Admission: RE | Payer: Self-pay | Source: Home / Self Care

## 2022-03-25 DIAGNOSIS — E119 Type 2 diabetes mellitus without complications: Secondary | ICD-10-CM

## 2022-03-25 DIAGNOSIS — I4891 Unspecified atrial fibrillation: Secondary | ICD-10-CM

## 2022-03-25 DIAGNOSIS — I10 Essential (primary) hypertension: Secondary | ICD-10-CM

## 2022-03-25 SURGERY — CARDIOVERSION
Anesthesia: Monitor Anesthesia Care

## 2022-03-28 DIAGNOSIS — I480 Paroxysmal atrial fibrillation: Secondary | ICD-10-CM | POA: Diagnosis not present

## 2022-03-28 DIAGNOSIS — I10 Essential (primary) hypertension: Secondary | ICD-10-CM | POA: Diagnosis not present

## 2022-04-19 ENCOUNTER — Ambulatory Visit (INDEPENDENT_AMBULATORY_CARE_PROVIDER_SITE_OTHER): Payer: Medicare Other

## 2022-04-19 ENCOUNTER — Encounter: Payer: Self-pay | Admitting: Cardiology

## 2022-04-19 ENCOUNTER — Ambulatory Visit: Payer: Medicare Other | Admitting: Cardiology

## 2022-04-19 VITALS — BP 142/70 | HR 61 | Ht 62.0 in | Wt 181.4 lb

## 2022-04-19 DIAGNOSIS — E119 Type 2 diabetes mellitus without complications: Secondary | ICD-10-CM

## 2022-04-19 DIAGNOSIS — I480 Paroxysmal atrial fibrillation: Secondary | ICD-10-CM | POA: Diagnosis not present

## 2022-04-19 DIAGNOSIS — I10 Essential (primary) hypertension: Secondary | ICD-10-CM | POA: Diagnosis not present

## 2022-04-19 DIAGNOSIS — I495 Sick sinus syndrome: Secondary | ICD-10-CM

## 2022-04-19 DIAGNOSIS — Z8679 Personal history of other diseases of the circulatory system: Secondary | ICD-10-CM

## 2022-04-19 MED ORDER — APIXABAN 5 MG PO TABS: 5.0000 mg | ORAL_TABLET | Freq: Two times a day (BID) | ORAL | 3 refills | Status: DC

## 2022-04-19 MED ORDER — HYDROCHLOROTHIAZIDE 25 MG PO TABS: 12.5000 mg | ORAL_TABLET | Freq: Every day | ORAL | 3 refills | Status: DC

## 2022-04-19 NOTE — Progress Notes (Unsigned)
Cardiology Office Note   Date:  04/20/2022   ID:  Jennifer Morrison, DOB 17-Sep-1946, MRN [id number]  PCP:  Asencion Noble, MD  Cardiologist:  saw Dr. Debara Pickett at [facility]    Chief Complaint  Patient presents with   Atrial Fibrillation      History of Present Illness: [name] is a 76 y.o. female who presents for post hospital.  Hx of PAF, HTN, DM.  Previeously seen for SVT and started on coreg by her PCP in 2022.  In May she had a fib found on her apple watch and converted spontaneously.  Eliquis started.  03/20/22 she was seen at [facility] for a fib with RVR with rates 120-140s.   Mg+ was 1.5 and replaced.  Neg trop and normal TSH  she was placed on dilt and BB. Lopressor instead of coreg.   Plan for outpt DCCV. Echo in May with EF 60-65% G1DD  On arrival for DCCV pt was in NSR.    Today she is maintainig SR though she feels tired freq and HR is in 30s at times per apple watch and at other times with any activity HR goes up quickly.  She has not missed any eliquis doses but was given PE dose pack for eliquis instead of a fib.  She has been taking her late husband's eliquis 5 mg BID she reviewed with her PCP.  The hospital pharmacist asked pt not to take the 10 mg BID.   Additionally her BP is higher than it has been - she is on avapro but her HCTZ was stopped and she has some lower ext edema so will add back.   No chest pain or SOB.    Past Medical History:  Diagnosis Date   Actinic keratosis 08/23/2018   L lat calf - bx proven   Actinic keratosis 06/24/2021   R med thigh - bx proven   Basal cell carcinoma 09/12/2019   R nasal facial angle near eyelid   Diabetes mellitus without complication (HCC)    NIDDM x 1 yr   GERD (gastroesophageal reflux disease)    Hypercholesteremia    Hypertension    Uterine cancer ([place]) 08/14/2016    Past Surgical History:  Procedure Laterality Date   ABDOMINAL HYSTERECTOMY     CATARACT EXTRACTION W/PHACO Right 06/04/2018   Procedure: CATARACT EXTRACTION  PHACO AND INTRAOCULAR LENS PLACEMENT RIGHT EYE CDE=7.64;  Surgeon: Jennifer Branch, MD;  Location: [facility];  Service: Ophthalmology;  Laterality: Right;  right   CATARACT EXTRACTION W/PHACO Left 07/09/2018   Procedure: CATARACT EXTRACTION PHACO AND INTRAOCULAR LENS PLACEMENT (IOC);  Surgeon: Jennifer Branch, MD;  Location: [facility];  Service: Ophthalmology;  Laterality: Left;  CDE: 5.53   COLONOSCOPY  2005   COLONOSCOPY N/A 11/28/2013   Procedure: COLONOSCOPY;  Surgeon: Rogene Houston, MD;  Location[facility];  Service: Endoscopy;  Laterality: N/A;  830   Polyp removed from uterus     ROBOTIC ASSISTED LAP VAGINAL HYSTERECTOMY N/A 09/06/2016   Procedure: XI ROBOTIC ASSISTED LAPAROSCOPIC TOTAL HYSTERECTOMY;  Surgeon: Everitt Amber, MD;  Location: [facility];  Service: Gynecology;  Laterality: N/A;   ROBOTIC ASSISTED SALPINGO OOPHERECTOMY Bilateral 09/06/2016   Procedure: XI ROBOTIC ASSISTED SALPINGO OOPHORECTOMY;  Surgeon: Everitt Amber, MD;  [facility]: [facility];  Service: Gynecology;  Laterality: Bilateral;   SENTINEL NODE BIOPSY N/A 09/06/2016   Procedure: SENTINEL NODE BIOPSY;  Surgeon: Everitt Amber, MD;  [facility]: [facility];  Service: Gynecology;  Laterality: N/A;  Current Outpatient Medications  Medication Sig Dispense Refill   apixaban (ELIQUIS) 5 MG TABS tablet Take 1 tablet (5 mg total) by mouth 2 (two) times daily. 180 tablet 3   diltiazem (CARDIZEM CD) 120 MG 24 hr capsule Take 1 capsule (120 mg total) by mouth daily. 30 capsule 2   hydrochlorothiazide (HYDRODIURIL) 25 MG tablet Take 0.5 tablets (12.5 mg total) by mouth daily. 45 tablet 3   irbesartan (AVAPRO) 75 MG tablet Take 1 tablet (75 mg total) by mouth daily. 30 tablet 2   metFORMIN (GLUCOPHAGE) 500 MG tablet Take 500 mg by mouth 2 (two) times daily.      metoprolol tartrate (LOPRESSOR) 25 MG tablet Take 1 tablet (25 mg total) by mouth 2 (two) times daily. 60 tablet 2   omeprazole (PRILOSEC) 20 MG capsule Take 20 mg by mouth daily.     pravastatin  (PRAVACHOL) 20 MG tablet Take 20 mg by mouth every evening.      No current facility-administered medications for this visit.    Allergies:   Patient has no known allergies.    Social History:  The patient  reports that she has never smoked. She has never used smokeless tobacco. She reports that she does not drink alcohol and does not use drugs.   Family History:  The patient's family history includes Diabetes in her brother and father.    ROS:  General:no colds or fevers, no weight changes Skin:no rashes or ulcers HEENT:no blurred vision, no congestion CV:see HPI PUL:see HPI GI:no diarrhea constipation or melena, no indigestion GU:no hematuria, no dysuria MS:no joint pain, no claudication Neuro:no syncope, no lightheadedness Endo:+ diabetes, no thyroid disease  Wt Readings from Last 3 Encounters:  04/19/22 181 lb 6.4 oz (82.3 kg)  03/23/22 173 lb 11.6 oz (78.8 kg)  03/20/22 173 lb 11.6 oz (78.8 kg)     PHYSICAL EXAM: VS:  BP (!) 142/70   Pulse 61   Ht '5\' 2"'$  (1.575 m)   Wt 181 lb 6.4 oz (82.3 kg)   SpO2 95%   BMI 33.18 kg/m  , BMI Body mass index is 33.18 kg/m. General:Pleasant affect, NAD Skin:Warm and dry, brisk capillary refill HEENT:normocephalic, sclera clear, mucus membranes moist Neck:supple, no JVD, no bruits  Heart:S1S2 RRR without murmur, gallup, rub or click Lungs:clear without rales, rhonchi, or wheezes QIH:KVQQ, non tender, + BS, do not palpate liver spleen or masses Ext:tr to 1+ lower ext edema, 2+ pedal pulses, 2+ radial pulses Neuro:alert and oriented X3, MAE, follows commands, + facial symmetry    EKG:  EKG is ordered today. The ekg ordered today demonstrates SR non specific T wave abnormality but no acute changes I personally reviewed    Recent Labs: 03/20/2022: ALT 18; B Natriuretic Peptide 210.0; TSH 1.923 03/21/2022: BUN 22; Creatinine, Ser 1.20; Hemoglobin 12.9; Magnesium 2.2; Platelets 379; Potassium 3.7; Sodium 137    Lipid Panel No  results found for: "CHOL", "TRIG", "HDL", "CHOLHDL", "VLDL", "LDLCALC", "LDLDIRECT"     Other studies Reviewed: Additional studies/ records that were reviewed today include: . TTE 02/2022 IMPRESSIONS     1. Left ventricular ejection fraction, by estimation, is 60 to 65%. The  left ventricle has normal function. The left ventricle has no regional  wall motion abnormalities. Left ventricular diastolic parameters are  consistent with Grade I diastolic  dysfunction (impaired relaxation).   2. Right ventricular systolic function is normal. The right ventricular  size is normal. There is mildly elevated pulmonary artery systolic  pressure.  3. The mitral valve is normal in structure. No evidence of mitral valve  regurgitation. No evidence of mitral stenosis.   4. The tricuspid valve is abnormal.   5. The aortic valve is tricuspid. Aortic valve regurgitation is not  visualized. No aortic stenosis is present.   6. The inferior vena cava is normal in size with greater than 50%  respiratory variability, suggesting right atrial pressure of 3 mmHg.   FINDINGS   Left Ventricle: Left ventricular ejection fraction, by estimation, is 60  to 65%. The left ventricle has normal function. The left ventricle has no  regional wall motion abnormalities. The left ventricular internal cavity  size was normal in size. There is   no left ventricular hypertrophy. Left ventricular diastolic parameters  are consistent with Grade I diastolic dysfunction (impaired relaxation).  Normal left ventricular filling pressure.   Right Ventricle: The right ventricular size is normal. Right vetricular  wall thickness was not well visualized. Right ventricular systolic  function is normal. There is mildly elevated pulmonary artery systolic  pressure. The tricuspid regurgitant velocity   is 3.09 m/s, and with an assumed right atrial pressure of 3 mmHg, the  estimated right ventricular systolic pressure is 40.9 mmHg.    Left Atrium: Left atrial size was normal in size.   Right Atrium: Right atrial size was normal in size.   Pericardium: There is no evidence of pericardial effusion.   Mitral Valve: The mitral valve is normal in structure. No evidence of  mitral valve regurgitation. No evidence of mitral valve stenosis.   Tricuspid Valve: The tricuspid valve is abnormal. Tricuspid valve  regurgitation is mild . No evidence of tricuspid stenosis.   Aortic Valve: The aortic valve is tricuspid. Aortic valve regurgitation is  not visualized. No aortic stenosis is present. Aortic valve mean gradient  measures 4.8 mmHg. Aortic valve peak gradient measures 9.4 mmHg.   Pulmonic Valve: The pulmonic valve was not well visualized. Pulmonic valve  regurgitation is not visualized. No evidence of pulmonic stenosis.   ASSESSMENT AND PLAN:  1.  PAF maintaining SR on dilt and BB.  Now with complaints of rapid HR with exertion and slow HR at rest. Possible tachy brady syndrome will have her wear 3 day monitor to eval a fib burden and bradycardia.  Will hold adjusting meds until we can see her rhythm.   2.  Anticoagulation on eliquis 5 mg BID we re-ordered.denies any bleeding.issues  3. HTN with increase of BP since discharge and lower ext edema. Will add back low dose HCTZ.  Will recheck her BMP in 2-3 weeks.  (Prior to hospitalization was on valsartan /HCTZ 160-25 mg may need to adjust on next visit.   4.  DM per PCP and stable.   Follow up with [facility] in 4 weeks.    Current medicines are reviewed with the patient today.  The patient Has no concerns regarding medicines.  The following changes have been made:  See above Labs/ tests ordered today include:see above  Disposition:   FU:  see above  Signed, Cecilie Kicks, NP  04/20/2022 11:31 AM    [address] [place], [place] Phone: [phone]; Fax: [phone]

## 2022-04-19 NOTE — Patient Instructions (Signed)
Medication Instructions:   Take Eliquis 5 mg twice a day   START HCTZ 12.5 mg daily for blood pressure   Labwork: None today  Testing/Procedures: ZIO XT- Long Term Monitor Instructions   Your physician has requested you wear your ZIO patch monitor____3___days.   This is a single patch monitor.  [employer] supplies one patch monitor per enrollment.  Additional stickers are not available.   Please do not apply patch if you will be having a Nuclear Stress Test, Echocardiogram, Cardiac CT, MRI, or Chest Xray during the time frame you would be wearing the monitor. The patch cannot be worn during these tests.  You cannot remove and re-apply the ZIO XT patch monitor. Do not shower for the first 24 hours.  You may shower after the first 24 hours.   Press button if you feel a symptom. You will hear a small click.  Record Date, Time and Symptom in the Patient Log Book.   When you are ready to remove patch, follow instructions on last 2 pages of Patient Log Book.  Stick patch monitor onto last page of Patient Log Book.   Place Patient Log Book in [facility] box.  Use locking tab on box and tape box closed securely.  The [employer] has [employer] on it.  Please place in mailbox as soon as possible.  Your physician should have your test results approximately 7 days after the monitor has been mailed back to [facility].   Call [facility] at [phone] if you have questions regarding your ZIO XT patch monitor.  Call them immediately if you see an orange light blinking on your monitor.   If your monitor falls off in less than 4 days contact our Monitor department at [phone].  If your monitor becomes loose or falls off after 4 days call [employer] at [phone] for suggestions on securing your monitor.    Follow-Up: 3 months  Any Other Special Instructions Will Be Listed Below (If Applicable).  If you need a refill on your cardiac medications before your next  appointment, please call your pharmacy.

## 2022-04-20 ENCOUNTER — Telehealth: Payer: Self-pay

## 2022-04-20 DIAGNOSIS — Z79899 Other long term (current) drug therapy: Secondary | ICD-10-CM

## 2022-04-20 NOTE — Telephone Encounter (Signed)
-----   Message from Jennifer Serge, NP sent at 04/20/2022 12:23 PM EDT ----- Could we please have her have labs in 2 weeks for new medication, HCTZ a BMP to make sure K+ is stable   thanks. [name]

## 2022-04-20 NOTE — Telephone Encounter (Signed)
Patient notified and verbalized understanding. 

## 2022-04-27 ENCOUNTER — Ambulatory Visit: Payer: Medicare Other | Admitting: Student

## 2022-04-28 DIAGNOSIS — I495 Sick sinus syndrome: Secondary | ICD-10-CM | POA: Diagnosis not present

## 2022-05-03 ENCOUNTER — Other Ambulatory Visit (HOSPITAL_COMMUNITY)
Admission: RE | Admit: 2022-05-03 | Discharge: 2022-05-03 | Disposition: A | Discharge status: Home / Self Care | Payer: Medicare Other | Source: Ambulatory Visit | Attending: Cardiology | Admitting: Cardiology

## 2022-05-03 DIAGNOSIS — Z79899 Other long term (current) drug therapy: Secondary | ICD-10-CM | POA: Insufficient documentation

## 2022-05-03 LAB — BASIC METABOLIC PANEL
Anion gap: 8 (ref 5–15)
BUN: 23 mg/dL (ref 8–23)
CO2: 25 mmol/L (ref 22–32)
Calcium: 9.3 mg/dL (ref 8.9–10.3)
Chloride: 105 mmol/L (ref 98–111)
Creatinine, Ser: 1.26 mg/dL — ABNORMAL HIGH (ref 0.44–1.00)
GFR, Estimated: 45 mL/min — ABNORMAL LOW (ref >60)
Glucose, Bld: 149 mg/dL — ABNORMAL HIGH (ref 70–99)
Potassium: 4 mmol/L (ref 3.5–5.1)
Sodium: 138 mmol/L (ref 135–145)

## 2022-05-04 ENCOUNTER — Other Ambulatory Visit: Payer: Self-pay

## 2022-05-04 DIAGNOSIS — I495 Sick sinus syndrome: Secondary | ICD-10-CM

## 2022-05-12 ENCOUNTER — Ambulatory Visit: Payer: Medicare Other | Admitting: Internal Medicine

## 2022-05-12 ENCOUNTER — Encounter: Payer: Self-pay | Admitting: Internal Medicine

## 2022-05-12 VITALS — BP 136/84 | HR 59 | Ht 62.0 in | Wt 174.2 lb

## 2022-05-12 DIAGNOSIS — I495 Sick sinus syndrome: Secondary | ICD-10-CM | POA: Diagnosis not present

## 2022-05-12 NOTE — Patient Instructions (Signed)
Medication Instructions:  Your physician recommends that you continue on your current medications as directed. Please refer to the Current Medication list given to you today.  *If you need a refill on your cardiac medications before your next appointment, please call your pharmacy*   Lab Work: NONE   If you have labs (blood work) drawn today and your tests are completely normal, you will receive your results only by: MyChart Message (if you have MyChart) OR A paper copy in the mail If you have any lab test that is abnormal or we need to change your treatment, we will call you to review the results.   Testing/Procedures: NONE    Follow-Up: At [facility], you and your health needs are our priority.  As part of our continuing mission to provide you with exceptional heart care, we have created designated Provider Care Teams.  These Care Teams include your primary Cardiologist (physician) and Advanced Practice Providers (APPs -  Physician Assistants and Nurse Practitioners) who all work together to provide you with the care you need, when you need it.  We recommend signing up for the patient portal called "MyChart".  Sign up information is provided on this After Visit Summary.  MyChart is used to connect with patients for Virtual Visits (Telemedicine).  Patients are able to view lab/test results, encounter notes, upcoming appointments, etc.  Non-urgent messages can be sent to your provider as well.   To learn more about what you can do with MyChart, go to [email or web address].    Your next appointment:    As Needed   The format for your next appointment:   In Person  Provider:   Gregg Taylor, MD    Other Instructions Thank you for choosing  [facility]!    Important Information About Sugar       

## 2022-05-12 NOTE — Progress Notes (Signed)
HPI Ms. Whiteside is referred by Cecilie Kicks, NP-C for evaluation of tachy-brady syndrome. She has a h/o PAF and has been placed on systemic anti-coagulation. The patient has not had syncope and she is minimally symptomatic. She denies chest pain or sob. On her cardiac monitor she had brief episodes of SVT/AT/Afib and sinus brady with HR's in the 40's. She was asymptomatic.  No Known Allergies   Current Outpatient Medications  Medication Sig Dispense Refill   apixaban (ELIQUIS) 5 MG TABS tablet Take 1 tablet (5 mg total) by mouth 2 (two) times daily. 180 tablet 3   diltiazem (CARDIZEM CD) 120 MG 24 hr capsule Take 1 capsule (120 mg total) by mouth daily. 30 capsule 2   hydrochlorothiazide (HYDRODIURIL) 25 MG tablet Take 0.5 tablets (12.5 mg total) by mouth daily. 45 tablet 3   irbesartan (AVAPRO) 75 MG tablet Take 1 tablet (75 mg total) by mouth daily. 30 tablet 2   metFORMIN (GLUCOPHAGE) 500 MG tablet Take 500 mg by mouth 2 (two) times daily.      metoprolol tartrate (LOPRESSOR) 25 MG tablet Take 1 tablet (25 mg total) by mouth 2 (two) times daily. 60 tablet 2   omeprazole (PRILOSEC) 20 MG capsule Take 20 mg by mouth daily.     pravastatin (PRAVACHOL) 20 MG tablet Take 20 mg by mouth every evening.      No current facility-administered medications for this visit.     Past Medical History:  Diagnosis Date   Actinic keratosis 08/23/2018   L lat calf - bx proven   Actinic keratosis 06/24/2021   R med thigh - bx proven   Basal cell carcinoma 09/12/2019   R nasal facial angle near eyelid   Diabetes mellitus without complication (HCC)    NIDDM x 1 yr   GERD (gastroesophageal reflux disease)    Hypercholesteremia    Hypertension    Uterine cancer ([place]) 08/14/2016    ROS:   All systems reviewed and negative except as noted in the HPI.   Past Surgical History:  Procedure Laterality Date   ABDOMINAL HYSTERECTOMY     CATARACT EXTRACTION W/PHACO Right 06/04/2018   Procedure:  CATARACT EXTRACTION PHACO AND INTRAOCULAR LENS PLACEMENT RIGHT EYE CDE=7.64;  Surgeon: Tonny Branch, MD;  Location[facility];  Service: Ophthalmology;  Laterality: Right;  right   CATARACT EXTRACTION W/PHACO Left 07/09/2018   Procedure: CATARACT EXTRACTION PHACO AND INTRAOCULAR LENS PLACEMENT (IOC);  Surgeon: Tonny Branch, MD;  Location: [facility];  Service: Ophthalmology;  Laterality: Left;  CDE: 5.53   COLONOSCOPY  2005   COLONOSCOPY N/A 11/28/2013   Procedure: COLONOSCOPY;  Surgeon: Rogene Houston, MD;  [facility];  Service: Endoscopy;  Laterality: N/A;  830   Polyp removed from uterus     ROBOTIC ASSISTED LAP VAGINAL HYSTERECTOMY N/A 09/06/2016   Procedure: XI ROBOTIC ASSISTED LAPAROSCOPIC TOTAL HYSTERECTOMY;  Surgeon: Everitt Amber, MD;  [facility];  Service: Gynecology;  Laterality: N/A;   ROBOTIC ASSISTED SALPINGO OOPHERECTOMY Bilateral 09/06/2016   Procedure: XI ROBOTIC ASSISTED SALPINGO OOPHORECTOMY;  Surgeon: Everitt Amber, MD;  [facility];  Service: Gynecology;  Laterality: Bilateral;   SENTINEL NODE BIOPSY N/A 09/06/2016   Procedure: SENTINEL NODE BIOPSY;  Surgeon: Everitt Amber, MD;  [facility];  Service: Gynecology;  Laterality: N/A;     Family History  Problem Relation Age of Onset   Diabetes Father    Diabetes Brother    Colon cancer Neg Hx  Social History   Socioeconomic History   Marital status: Widowed    Spouse name: Broadus John   Number of children: Not on file   Years of education: Not on file   Highest education level: Not on file  Occupational History   Not on file  Tobacco Use   Smoking status: Never   Smokeless tobacco: Never  Vaping Use   Vaping Use: Never used  Substance and Sexual Activity   Alcohol use: No   Drug use: No   Sexual activity: Yes  Other Topics Concern   Not on file  Social History Narrative   Not on file   Social Determinants of Health   Financial Resource Strain: Not on file  Food Insecurity: Not on file   Transportation Needs: Not on file  Physical Activity: Not on file  Stress: Not on file  Social Connections: Not on file  Intimate Partner Violence: Not on file     BP 136/84   Pulse (!) 59   Ht '5\' 2"'$  (1.575 m)   Wt 174 lb 3.2 oz (79 kg)   SpO2 97%   BMI 31.86 kg/m   Physical Exam:  Well appearing NAD HEENT: Unremarkable Neck:  No JVD, no thyromegally Lymphatics:  No adenopathy Back:  No CVA tenderness Lungs:  Clear with no wheezes HEART:  Regular rate rhythm, no murmurs, no rubs, no clicks Abd:  soft, positive bowel sounds, no organomegally, no rebound, no guarding Ext:  2 plus pulses, no edema, no cyanosis, no clubbing Skin:  No rashes no nodules Neuro:  CN II through XII intact, motor grossly intact  DEVICE  Normal device function.  See PaceArt for details.   Assess/Plan:  Tachy-brady - she is minimally symptomatic. She will continue her current meds. No indication for either ablation or PPM. Continue current meds. HTN - her bp is well controlled. Continue beta blocker and calcium channel blocker.  3. Coags - she has not had any bleeding on her systemic anti-coagulation. Continue.

## 2022-06-02 ENCOUNTER — Ambulatory Visit: Payer: Medicare Other | Admitting: Dermatology

## 2022-06-02 DIAGNOSIS — Z1283 Encounter for screening for malignant neoplasm of skin: Secondary | ICD-10-CM | POA: Diagnosis not present

## 2022-06-02 DIAGNOSIS — D229 Melanocytic nevi, unspecified: Secondary | ICD-10-CM

## 2022-06-02 DIAGNOSIS — D1800 Hemangioma unspecified site: Secondary | ICD-10-CM | POA: Diagnosis not present

## 2022-06-02 DIAGNOSIS — L578 Other skin changes due to chronic exposure to nonionizing radiation: Secondary | ICD-10-CM | POA: Diagnosis not present

## 2022-06-02 DIAGNOSIS — L821 Other seborrheic keratosis: Secondary | ICD-10-CM | POA: Diagnosis not present

## 2022-06-02 DIAGNOSIS — Z85828 Personal history of other malignant neoplasm of skin: Secondary | ICD-10-CM

## 2022-06-02 DIAGNOSIS — L814 Other melanin hyperpigmentation: Secondary | ICD-10-CM

## 2022-06-02 NOTE — Patient Instructions (Addendum)
Due to recent changes in healthcare laws, you may see results of your pathology and/or laboratory studies on MyChart before the doctors have had a chance to review them. We understand that in some cases there may be results that are confusing or concerning to you. Please understand that not all results are received at the same time and often the doctors may need to interpret multiple results in order to provide you with the best plan of care or course of treatment. Therefore, we ask that you please give us 2 business days to thoroughly review all your results before contacting the office for clarification. Should we see a critical lab result, you will be contacted sooner.   If You Need Anything After Your Visit  If you have any questions or concerns for your doctor, please call our main line at [phone] and press option 4 to reach your doctor's medical assistant. If no one answers, please leave a voicemail as directed and we will return your call as soon as possible. Messages left after 4 pm will be answered the following business day.   You may also send us a message via MyChart. We typically respond to MyChart messages within 1-2 business days.  For prescription refills, please ask your pharmacy to contact our office. Our fax number is [phone].  If you have an urgent issue when the clinic is closed that cannot wait until the next business day, you can page your doctor at the number below.    Please note that while we do our best to be available for urgent issues outside of office hours, we are not available 24/7.   If you have an urgent issue and are unable to reach us, you may choose to seek medical care at your doctor's office, retail clinic, urgent care center, or emergency room.  If you have a medical emergency, please immediately call 911 or go to the emergency department.  Pager Numbers  - Dr. Kowalski: [phone]  - Dr. Moye: [phone]  - Dr. Stewart:  [phone]  In the event of inclement weather, please call our main line at [phone] for an update on the status of any delays or closures.  Dermatology Medication Tips: Please keep the boxes that topical medications come in in order to help keep track of the instructions about where and how to use these. Pharmacies typically print the medication instructions only on the boxes and not directly on the medication tubes.   If your medication is too expensive, please contact our office at [phone] option 4 or send us a message through MyChart.   We are unable to tell what your co-pay for medications will be in advance as this is different depending on your insurance coverage. However, we may be able to find a substitute medication at lower cost or fill out paperwork to get insurance to cover a needed medication.   If a prior authorization is required to get your medication covered by your insurance company, please allow us 1-2 business days to complete this process.  Drug prices often vary depending on where the prescription is filled and some pharmacies may offer cheaper prices.  The website www.goodrx.com contains coupons for medications through different pharmacies. The prices here do not account for what the cost may be with help from insurance (it may be cheaper with your insurance), but the website can give you the price if you did not use any insurance.  - You can print the associated coupon and take it with   your prescription to the pharmacy.  - You may also stop by our office during regular business hours and pick up a GoodRx coupon card.  - If you need your prescription sent electronically to a different pharmacy, notify our office through [facility] MyChart or by phone at [phone] option 4.     Si Usted Necesita Algo Despus de Su Visita  Tambin puede enviarnos un mensaje a travs de MyChart. Por lo general respondemos a los mensajes de MyChart en el transcurso de 1 a 2  das hbiles.  Para renovar recetas, por favor pida a su farmacia que se ponga en contacto con nuestra oficina. Nuestro nmero de fax es el [phone].  Si tiene un asunto urgente cuando la clnica est cerrada y que no puede esperar hasta el siguiente da hbil, puede llamar/localizar a su doctor(a) al nmero que aparece a continuacin.   Por favor, tenga en cuenta que aunque hacemos todo lo posible para estar disponibles para asuntos urgentes fuera del horario de oficina, no estamos disponibles las 24 horas del da, los 7 das de la semana.   Si tiene un problema urgente y no puede comunicarse con nosotros, puede optar por buscar atencin mdica  en el consultorio de su doctor(a), en una clnica privada, en un centro de atencin urgente o en una sala de emergencias.  Si tiene una emergencia mdica, por favor llame inmediatamente al 911 o vaya a la sala de emergencias.  Nmeros de bper  - Dr. Kowalski: [phone]  - Dra. Moye: [phone]  - Dra. Stewart: [phone]  En caso de inclemencias del tiempo, por favor llame a nuestra lnea principal al [phone] para una actualizacin sobre el estado de cualquier retraso o cierre.  Consejos para la medicacin en dermatologa: Por favor, guarde las cajas en las que vienen los medicamentos de uso tpico para ayudarle a seguir las instrucciones sobre dnde y cmo usarlos. Las farmacias generalmente imprimen las instrucciones del medicamento slo en las cajas y no directamente en los tubos del medicamento.   Si su medicamento es muy caro, por favor, pngase en contacto con nuestra oficina llamando al [phone] y presione la opcin 4 o envenos un mensaje a travs de MyChart.   No podemos decirle cul ser su copago por los medicamentos por adelantado ya que esto es diferente dependiendo de la cobertura de su seguro. Sin embargo, es posible que podamos encontrar un medicamento sustituto a menor costo o llenar un formulario para que el  seguro cubra el medicamento que se considera necesario.   Si se requiere una autorizacin previa para que su compaa de seguros cubra su medicamento, por favor permtanos de 1 a 2 das hbiles para completar este proceso.  Los precios de los medicamentos varan con frecuencia dependiendo del lugar de dnde se surte la receta y alguna farmacias pueden ofrecer precios ms baratos.  El sitio web www.goodrx.com tiene cupones para medicamentos de diferentes farmacias. Los precios aqu no tienen en cuenta lo que podra costar con la ayuda del seguro (puede ser ms barato con su seguro), pero el sitio web puede darle el precio si no utiliz ningn seguro.  - Puede imprimir el cupn correspondiente y llevarlo con su receta a la farmacia.  - Tambin puede pasar por nuestra oficina durante el horario de atencin regular y recoger una tarjeta de cupones de GoodRx.  - Si necesita que su receta se enve electrnicamente a una farmacia diferente, informe a nuestra oficina a travs de MyChart de [place]   o por telfono llamando al [phone] y presione la opcin 4.  

## 2022-06-02 NOTE — Progress Notes (Signed)
   Follow-Up Visit   Subjective  Jennifer Morrison is a 76 y.o. female who presents for the following: Annual Exam (Hx BCC, AK's).   The patient presents for Total-Body Skin Exam (TBSE) for skin cancer screening and mole check.  The patient has spots, moles and lesions to be evaluated, some may be new or changing.    The following portions of the chart were reviewed this encounter and updated as appropriate:   Tobacco  Allergies  Meds  Problems  Med Hx  Surg Hx  Fam Hx      Review of Systems:  No other skin or systemic complaints except as noted in HPI or Assessment and Plan.  Objective  Well appearing patient in no apparent distress; mood and affect are within normal limits.  A full examination was performed including scalp, head, eyes, ears, nose, lips, neck, chest, axillae, abdomen, back, buttocks, bilateral upper extremities, bilateral lower extremities, hands, feet, fingers, toes, fingernails, and toenails. All findings within normal limits unless otherwise noted below.    Assessment & Plan   Lentigines - Scattered tan macules - Due to sun exposure - Benign-appearing, observe - Recommend daily broad spectrum sunscreen SPF 30+ to sun-exposed areas, reapply every 2 hours as needed. - Call for any changes  Seborrheic Keratoses - Stuck-on, waxy, tan-brown papules and/or plaques  - Benign-appearing - Discussed benign etiology and prognosis. - Observe - Call for any changes  Melanocytic Nevi - Tan-brown and/or pink-flesh-colored symmetric macules and papules - Benign appearing on exam today - Observation - Call clinic for new or changing moles - Recommend daily use of broad spectrum spf 30+ sunscreen to sun-exposed areas.   Hemangiomas - Red papules - Discussed benign nature - Observe - Call for any changes  Actinic Damage - Chronic condition, secondary to cumulative UV/sun exposure - diffuse scaly erythematous macules with underlying dyspigmentation -  Recommend daily broad spectrum sunscreen SPF 30+ to sun-exposed areas, reapply every 2 hours as needed.  - Staying in the shade or wearing long sleeves, sun glasses (UVA+UVB protection) and wide brim hats (4-inch brim around the entire circumference of the hat) are also recommended for sun protection.  - Call for new or changing lesions.  History of Basal Cell Carcinoma of the Skin - No evidence of recurrence today - Recommend regular full body skin exams - Recommend daily broad spectrum sunscreen SPF 30+ to sun-exposed areas, reapply every 2 hours as needed.  - Call if any new or changing lesions are noted between office visits  Skin cancer screening performed today.  Return in about 1 year (around 06/03/2023) for TBSE.  Luther Redo, CMA, am acting as scribe for [name], MD .   Documentation: I have reviewed the above documentation for accuracy and completeness, and I agree with the above.  [name], MD

## 2022-06-06 ENCOUNTER — Encounter: Payer: Self-pay | Admitting: Dermatology

## 2022-06-21 DIAGNOSIS — E1129 Type 2 diabetes mellitus with other diabetic kidney complication: Secondary | ICD-10-CM | POA: Diagnosis not present

## 2022-06-21 DIAGNOSIS — Z79899 Other long term (current) drug therapy: Secondary | ICD-10-CM | POA: Diagnosis not present

## 2022-06-21 DIAGNOSIS — N1832 Chronic kidney disease, stage 3b: Secondary | ICD-10-CM | POA: Diagnosis not present

## 2022-06-21 DIAGNOSIS — I10 Essential (primary) hypertension: Secondary | ICD-10-CM | POA: Diagnosis not present

## 2022-06-28 DIAGNOSIS — E1122 Type 2 diabetes mellitus with diabetic chronic kidney disease: Secondary | ICD-10-CM | POA: Diagnosis not present

## 2022-06-28 DIAGNOSIS — N1832 Chronic kidney disease, stage 3b: Secondary | ICD-10-CM | POA: Diagnosis not present

## 2022-06-28 DIAGNOSIS — I10 Essential (primary) hypertension: Secondary | ICD-10-CM | POA: Diagnosis not present

## 2022-06-28 DIAGNOSIS — E1129 Type 2 diabetes mellitus with other diabetic kidney complication: Secondary | ICD-10-CM | POA: Diagnosis not present

## 2022-06-28 DIAGNOSIS — Z23 Encounter for immunization: Secondary | ICD-10-CM | POA: Diagnosis not present

## 2022-06-28 DIAGNOSIS — R7309 Other abnormal glucose: Secondary | ICD-10-CM | POA: Diagnosis not present

## 2022-06-28 DIAGNOSIS — I480 Paroxysmal atrial fibrillation: Secondary | ICD-10-CM | POA: Diagnosis not present

## 2022-07-22 ENCOUNTER — Ambulatory Visit: Payer: Medicare Other | Attending: Student | Admitting: Student

## 2022-07-22 ENCOUNTER — Encounter: Payer: Self-pay | Admitting: Student

## 2022-07-22 VITALS — BP 140/82 | HR 58 | Ht 62.0 in | Wt 178.6 lb

## 2022-07-22 DIAGNOSIS — I480 Paroxysmal atrial fibrillation: Secondary | ICD-10-CM

## 2022-07-22 DIAGNOSIS — E782 Mixed hyperlipidemia: Secondary | ICD-10-CM | POA: Diagnosis not present

## 2022-07-22 DIAGNOSIS — I10 Essential (primary) hypertension: Secondary | ICD-10-CM

## 2022-07-22 DIAGNOSIS — Z79899 Other long term (current) drug therapy: Secondary | ICD-10-CM | POA: Diagnosis not present

## 2022-07-22 MED ORDER — DILTIAZEM HCL ER COATED BEADS 120 MG PO CP24
120.0000 mg | ORAL_CAPSULE | Freq: Every day | ORAL | 3 refills | Status: DC
Start: 2022-07-22 — End: 2023-04-28

## 2022-07-22 MED ORDER — METOPROLOL TARTRATE 25 MG PO TABS: 25.0000 mg | ORAL_TABLET | Freq: Two times a day (BID) | ORAL | 3 refills | Status: DC

## 2022-07-22 MED ORDER — IRBESARTAN 150 MG PO TABS: 150.0000 mg | ORAL_TABLET | Freq: Every day | ORAL | 3 refills | Status: DC

## 2022-07-22 NOTE — Patient Instructions (Signed)
Medication Instructions:   Increase Irbesartan to 150 mg Daily   *If you need a refill on your cardiac medications before your next appointment, please call your pharmacy*   Lab Work: Your physician recommends that you return for lab work in: 2 Weeks    If you have labs (blood work) drawn today and your tests are completely normal, you will receive your results only by: [facility] (if you have MyChart) OR A paper copy in the mail If you have any lab test that is abnormal or we need to change your treatment, we will call you to review the results.   Testing/Procedures: NONE    Follow-Up: At [facility], you and your health needs are our priority.  As part of our continuing mission to provide you with exceptional heart care, we have created designated Provider Care Teams.  These Care Teams include your primary Cardiologist (physician) and Advanced Practice Providers (APPs -  Physician Assistants and Nurse Practitioners) who all work together to provide you with the care you need, when you need it.  We recommend signing up for the patient portal called "MyChart".  Sign up information is provided on this After Visit Summary.  MyChart is used to connect with patients for Virtual Visits (Telemedicine).  Patients are able to view lab/test results, encounter notes, upcoming appointments, etc.  Non-urgent messages can be sent to your provider as well.   To learn more about what you can do with MyChart, go to [email or web address].    Your next appointment:   6 month(s)  The format for your next appointment:   In Person  Provider:   Bernerd Pho, PA-C or MD      Other Instructions Thank you for choosing [facility]!    Important Information About Sugar

## 2022-07-22 NOTE — Progress Notes (Signed)
Cardiology Office Note    Date:  07/22/2022   ID:  Jennifer Morrison, DOB [birth year], MRN [id number]  PCP:  Jennifer Noble, MD  Cardiologist: Evaluated by Dr. Debara Pickett during prior admission in 03/2022 EP: Dr. Lovena Le  Chief Complaint  Patient presents with   Follow-up    3 month visit    History of Present Illness:    Jennifer Morrison is a 76 y.o. female with past medical history of paroxysmal atrial fibrillation, HTN, HLD and Type II DM who presents to the office today for 46-monthfollow-up.  [facility] with RVR (previously diagnosed during ED visit in 02/2022 and spontaneously converted to NSR). She was started on Cardizem with Coreg being switched to Metoprolol and she was continued on Eliquis. She was initially going to undergo an outpatient DCCV but had converted back to NSR at the time of her preoperative anesthesia appointment on 03/23/2022. She did see LCecilie Kicks NP in 04/2022 and was maintaining NSR but did note her heart rate was in the 30's at time and she felt fatigued with this. It was recommended to proceed with a 3-day monitor to assess for tachy-brady syndrome. She was also started back on HCTZ 12.5 mg daily given lower extremity edema. Her monitor showed predominantly normal sinus rhythm with an average heart rate of 59 bpm. She did have 14 episodes of SVT with the longest episode lasting 12 beats. She was referred to EP and did meet with Dr. TLovena Lein 05/2022 but reported overall feeling well with her current medications and there was felt to be no indication for ablation or PPM placement. She was continued on current medical therapy.  In talking with the patient today, she reports overall doing well since her last office visit. She has been checking her heart rate at home and says this has been in the 50's to 60's with occasional readings in the 40's. Denies any associated lightheadedness, dizziness or presyncope.  Her palpitations have overall been well controlled as well. No recent dyspnea on exertion, orthopnea, PND or pitting edema. She has been checking her blood pressure at home and this has been elevated with SBP in the 140's to 170's at times.   Past Medical History:  Diagnosis Date   Actinic keratosis 08/23/2018   L lat calf - bx proven   Actinic keratosis 06/24/2021   R med thigh - bx proven   Basal cell carcinoma 09/12/2019   R nasal facial angle near eyelid   Diabetes mellitus without complication (HCC)    NIDDM x 1 yr   GERD (gastroesophageal reflux disease)    Hypercholesteremia    Hypertension    [place]    [place] [place]    Past Surgical History:  Procedure Laterality Date   ABDOMINAL HYSTERECTOMY     CATARACT EXTRACTION W/PHACO Right 06/04/2018   Procedure: CATARACT EXTRACTION PHACO AND INTRAOCULAR LENS PLACEMENT RIGHT EYE CDE=7.64;  Surgeon: HTonny Branch MD;  Location: [facility] ORS;  Service: Ophthalmology;  Laterality: Right;  right   CATARACT EXTRACTION W/PHACO Left 07/09/2018   Procedure: CATARACT EXTRACTION PHACO AND INTRAOCULAR LENS PLACEMENT (IOC);  Surgeon: HTonny Branch MD;  Location: [facility];  Service: Ophthalmology;  Laterality: Left;  CDE: 5.53   COLONOSCOPY  2005   COLONOSCOPY N/A 11/28/2013   Procedure: COLONOSCOPY;  Surgeon: NRogene Houston MD;  Location: [facility] SUITE;  Service: Endoscopy;  Laterality: N/A;  830   Polyp removed from uterus     ROBOTIC ASSISTED LAP VAGINAL HYSTERECTOMY N/A 09/06/2016   Procedure: XI ROBOTIC ASSISTED LAPAROSCOPIC TOTAL HYSTERECTOMY;  Surgeon: Everitt Amber, MD;  Location: [facility];  Service: Gynecology;  Laterality: N/A;   ROBOTIC ASSISTED SALPINGO OOPHERECTOMY Bilateral 09/06/2016   Procedure: XI ROBOTIC ASSISTED SALPINGO OOPHORECTOMY;  Surgeon: Everitt Amber, MD;  Location: [facility];  Service: Gynecology;  Laterality: Bilateral;   SENTINEL NODE BIOPSY N/A 09/06/2016   Procedure: SENTINEL NODE  BIOPSY;  Surgeon: Everitt Amber, MD;  Location: [facility];  Service: Gynecology;  Laterality: N/A;    Current Medications: Outpatient Medications Prior to Visit  Medication Sig Dispense Refill   apixaban (ELIQUIS) 5 MG TABS tablet Take 1 tablet (5 mg total) by mouth 2 (two) times daily. 180 tablet 3   hydrochlorothiazide (HYDRODIURIL) 25 MG tablet Take 0.5 tablets (12.5 mg total) by mouth daily. 45 tablet 3   metFORMIN (GLUCOPHAGE) 500 MG tablet Take 500 mg by mouth 2 (two) times daily.      omeprazole (PRILOSEC) 20 MG capsule Take 20 mg by mouth daily.     pravastatin (PRAVACHOL) 20 MG tablet Take 20 mg by mouth every evening.      diltiazem (CARDIZEM CD) 120 MG 24 hr capsule Take 1 capsule (120 mg total) by mouth daily. 30 capsule 2   irbesartan (AVAPRO) 75 MG tablet Take 1 tablet (75 mg total) by mouth daily. 30 tablet 2   metoprolol tartrate (LOPRESSOR) 25 MG tablet Take 1 tablet (25 mg total) by mouth 2 (two) times daily. 60 tablet 2   No facility-administered medications prior to visit.     Allergies:   Patient has no known allergies.   Social History   Socioeconomic History   Marital status: Widowed    Spouse name: Jennifer Morrison   Number of children: Not on file   Years of education: Not on file   Highest education level: Not on file  Occupational History   Not on file  Tobacco Use   Smoking status: Never   Smokeless tobacco: Never  Vaping Use   Vaping Use: Never used  Substance and Sexual Activity   Alcohol use: No   Drug use: No   Sexual activity: Yes  Other Topics Concern   Not on file  Social History Narrative   Not on file   Social Determinants of Health   Financial Resource Strain: Not on file  Food Insecurity: Not on file  Transportation Needs: Not on file  Physical Activity: Not on file  Stress: Not on file  Social Connections: Not on file     Family History:  The patient's family history includes Diabetes in her brother and father.   Review of Systems:     Please see the history of present illness.     All other systems reviewed and are otherwise negative except as noted above.   Physical Exam:    VS:  BP (!) 140/82   Pulse (!) 58   Ht 5' 2"  (1.575 m)   Wt 178 lb 9.6 oz (81 kg)   SpO2 99%   BMI 32.67 kg/m    General: Well developed, well nourished,female appearing in no acute distress. Head: Normocephalic, atraumatic. Neck: No carotid bruits. JVD not elevated.  Lungs: Respirations regular and unlabored, without wheezes or rales.  Heart: Regular rate and rhythm. No S3 or S4.  No murmur, no rubs, or gallops appreciated. Abdomen: Appears non-distended. No obvious abdominal masses. Msk:  Strength and tone appear normal for age. No obvious joint deformities or effusions. Extremities: No clubbing or cyanosis. No pitting edema.  Distal pedal pulses are 2+ bilaterally. Neuro: Alert and oriented X 3. Moves all extremities spontaneously. No focal deficits noted. Psych:  Responds to questions appropriately with a normal affect. Skin: No rashes or lesions noted  Wt Readings from Last 3 Encounters:  07/22/22 178 lb 9.6 oz (81 kg)  05/12/22 174 lb 3.2 oz (79 kg)  04/19/22 181 lb 6.4 oz (82.3 kg)     Studies/Labs Reviewed:   EKG:  EKG is not ordered today.  Recent Labs: 03/20/2022: ALT 18; B Natriuretic Peptide 210.0; TSH 1.923 03/21/2022: Hemoglobin 12.9; Magnesium 2.2; Platelets 379 05/03/2022: BUN 23; Creatinine, Ser 1.26; Potassium 4.0; Sodium 138   Lipid Panel No results found for: "CHOL", "TRIG", "HDL", "CHOLHDL", "VLDL", "LDLCALC", "LDLDIRECT"  Additional studies/ records that were reviewed today include:   Echocardiogram: 02/2022 IMPRESSIONS     1. Left ventricular ejection fraction, by estimation, is 60 to 65%. The  left ventricle has normal function. The left ventricle has no regional  wall motion abnormalities. Left ventricular diastolic parameters are  consistent with Grade I diastolic  dysfunction (impaired  relaxation).   2. Right ventricular systolic function is normal. The right ventricular  size is normal. There is mildly elevated pulmonary artery systolic  pressure.   3. The mitral valve is normal in structure. No evidence of mitral valve  regurgitation. No evidence of mitral stenosis.   4. The tricuspid valve is abnormal.   5. The aortic valve is tricuspid. Aortic valve regurgitation is not  visualized. No aortic stenosis is present.   6. The inferior vena cava is normal in size with greater than 50%  respiratory variability, suggesting right atrial pressure of 3 mmHg.    Event Monitor: 06/2022   3 day monitor   Rare supraventricular ectopy in the form of isolated PACs, couplets, triplets. 14 runs of SVT, longest 12 beats.   Rare ventricular ectopy in the form of isolated PVCs   No symptoms reported     Patch Wear Time:  2 days and 23 hours (2023-07-11T14:17:09-399 to 2023-07-14T14:02:57-0400)   Patient had a min HR of 44 bpm, max HR of 141 bpm, and avg HR of 59 bpm. Predominant underlying rhythm was Sinus Rhythm. 14 Supraventricular Tachycardia runs occurred, the run with the fastest interval lasting 10 beats with a max rate of 141 bpm, the  longest lasting 12 beats with an avg rate of 95 bpm. Isolated SVEs were rare (<1.0%), SVE Couplets were rare (<1.0%), and SVE Triplets were rare (<1.0%). Isolated VEs were rare (<1.0%), and no VE Couplets or VE Triplets were present.    Assessment:    1. Paroxysmal atrial fibrillation (HCC)   2. Essential hypertension   3. Medication management   4. Mixed hyperlipidemia      Plan:   In order of problems listed above:  1. Paroxysmal Atrial Fibrillation - She reports her palpitations have overall been well-controlled since her last office visit. Will continue current medical therapy with Cardizem CD 120 mg daily and Lopressor 25 mg twice daily. Would not further titrate given her heart rate in the high 40's to 50's at times. As discussed  above, she previously met with EP and continued medical management was recommended with no current indication for ablation or PPM placement. - No reports of active bleeding. She remains on Eliquis 5 mg twice daily for anticoagulation which is the  appropriate dose at this time given her current age, weight and renal function.  2. HTN - Her blood pressure was initially recorded at 170/80, rechecked and improved to 140/82. This has been elevated when checked at home. She is currently on Cardizem CD 120 mg daily, HCTZ 12.5 mg daily, Irbesartan 75 mg daily and Lopressor 25 mg twice daily. Would not further titrate Lopressor or Cardizem given her baseline heart rate typically in the 50's. Will titrate Irbesartan to 150 mg daily. Recheck BMET in 2 weeks.   3. HLD - Followed by her PCP. She remains on Pravastatin 60m daily.    Medication Adjustments/Labs and Tests Ordered: Current medicines are reviewed at length with the patient today.  Concerns regarding medicines are outlined above.  Medication changes, Labs and Tests ordered today are listed in the Patient Instructions below. Patient Instructions  Medication Instructions:   Increase Irbesartan to 150 mg Daily   *If you need a refill on your cardiac medications before your next appointment, please call your pharmacy*   Lab Work: Your physician recommends that you return for lab work in: 2 Weeks    If you have labs (blood work) drawn today and your tests are completely normal, you will receive your results only by: MGreene(if you have MyChart) OR A paper copy in the mail If you have any lab test that is abnormal or we need to change your treatment, we will call you to review the results.   Testing/Procedures: NONE    Follow-Up: [facility] and your [facility] needs are our priority.  As part of our continuing mission to provide you with exceptional heart care, we have created designated Provider Care Teams.  These  Care Teams include your primary Cardiologist (physician) and Advanced Practice Providers (APPs -  Physician Assistants and Nurse Practitioners) who all work together to provide you with the care you need, when you need it.  We recommend signing up for the patient portal called "MyChart".  Sign up information is provided on this After Visit Summary.  MyChart is used to connect with patients for Virtual Visits (Telemedicine).  Patients are able to view lab/test results, encounter notes, upcoming appointments, etc.  Non-urgent messages can be sent to your provider as well.   To learn more about what you can do with MyChart, go to [email or web address]    Your next appointment:   6 month(s)  The format for your next appointment:   In Person  Provider:   BBernerd Pho PA-C or MD      Other Instructions Thank you for choosing [facility]    Important Information About Sugar         Signed, BErma Heritage PA-C  07/22/2022 5:09 PM    CEmer[place] LakesS. [address] [place] [id number]: [phone]: [phone]

## 2022-08-05 DIAGNOSIS — Z79899 Other long term (current) drug therapy: Secondary | ICD-10-CM | POA: Diagnosis not present

## 2022-08-06 LAB — BASIC METABOLIC PANEL WITH GFR
BUN/Creatinine Ratio: 16 (ref 12–28)
BUN: 22 mg/dL (ref 8–27)
CO2: 23 mmol/L (ref 20–29)
Calcium: 9.9 mg/dL (ref 8.7–10.3)
Chloride: 102 mmol/L (ref 96–106)
Creatinine, Ser: 1.38 mg/dL — ABNORMAL HIGH (ref 0.57–1.00)
Glucose: 152 mg/dL — ABNORMAL HIGH (ref 70–99)
Potassium: 4.6 mmol/L (ref 3.5–5.2)
Sodium: 142 mmol/L (ref 134–144)
eGFR: 40 mL/min/1.73 — ABNORMAL LOW (ref >59)

## 2022-08-08 ENCOUNTER — Other Ambulatory Visit (HOSPITAL_COMMUNITY): Payer: Self-pay | Admitting: Internal Medicine

## 2022-08-08 DIAGNOSIS — N6310 Unspecified lump in the right breast, unspecified quadrant: Secondary | ICD-10-CM

## 2022-08-30 ENCOUNTER — Telehealth: Payer: Self-pay | Admitting: Student

## 2022-08-30 ENCOUNTER — Encounter (HOSPITAL_COMMUNITY): Payer: Self-pay

## 2022-08-30 ENCOUNTER — Ambulatory Visit (HOSPITAL_COMMUNITY)
Admission: RE | Admit: 2022-08-30 | Discharge: 2022-08-30 | Disposition: A | Discharge status: Home / Self Care | Payer: Medicare Other | Source: Ambulatory Visit | Attending: Internal Medicine | Admitting: Internal Medicine

## 2022-08-30 DIAGNOSIS — N6315 Unspecified lump in the right breast, overlapping quadrants: Secondary | ICD-10-CM | POA: Insufficient documentation

## 2022-08-30 DIAGNOSIS — N6310 Unspecified lump in the right breast, unspecified quadrant: Secondary | ICD-10-CM | POA: Diagnosis present

## 2022-08-30 DIAGNOSIS — Z79899 Other long term (current) drug therapy: Secondary | ICD-10-CM

## 2022-08-30 DIAGNOSIS — R92323 Mammographic fibroglandular density, bilateral breasts: Secondary | ICD-10-CM | POA: Diagnosis not present

## 2022-08-30 MED ORDER — IRBESARTAN 300 MG PO TABS: 300.0000 mg | ORAL_TABLET | Freq: Every day | ORAL | 3 refills | Status: DC

## 2022-08-30 NOTE — Telephone Encounter (Signed)
Patient notified and verbalized understanding. 

## 2022-08-30 NOTE — Telephone Encounter (Signed)
   Given her elevated readings, I would recommend increasing Irbesartan to 300 mg daily. Continue to follow BP trend and repeat BMET again in 2-3 weeks following medication adjustments.  Beets/beet juice can help but the effects are usually short-acting so it can cause very variable readings (will drop quickly and then spike). Would just keep a check on readings if wanting to try that.   Signed, Erma Heritage, PA-C 08/30/2022, 12:05 PM

## 2022-08-30 NOTE — Telephone Encounter (Signed)
Pt c/o medication issue:  1. Name of Medication:   irbesartan (AVAPRO) 150 MG tablet    2. How are you currently taking this medication (dosage and times per day)? Take 1 tablet (150 mg total) by mouth daily.   3. Are you having a reaction (difficulty breathing--STAT)? No  4. What is your medication issue? Pt would like a callback regarding BP results after taking medication. Please advise

## 2022-08-30 NOTE — Telephone Encounter (Signed)
Pt called to report current blood pressure readings. Pt would like to know if it is a good idea to take beets for her blood pressure.  134/64 57 150/73 59 140/65 57 133/65 55 133/63 56  Please advise.

## 2022-09-14 ENCOUNTER — Other Ambulatory Visit (HOSPITAL_COMMUNITY)
Admission: RE | Admit: 2022-09-14 | Discharge: 2022-09-14 | Disposition: A | Discharge status: Home / Self Care | Payer: Medicare Other | Source: Ambulatory Visit | Attending: Student | Admitting: Student

## 2022-09-14 ENCOUNTER — Telehealth: Payer: Self-pay | Admitting: *Deleted

## 2022-09-14 DIAGNOSIS — Z79899 Other long term (current) drug therapy: Secondary | ICD-10-CM | POA: Insufficient documentation

## 2022-09-14 LAB — BASIC METABOLIC PANEL
Anion gap: 8 (ref 5–15)
BUN: 24 mg/dL — ABNORMAL HIGH (ref 8–23)
CO2: 26 mmol/L (ref 22–32)
Calcium: 9.5 mg/dL (ref 8.9–10.3)
Chloride: 106 mmol/L (ref 98–111)
Creatinine, Ser: 1.31 mg/dL — ABNORMAL HIGH (ref 0.44–1.00)
GFR, Estimated: 43 mL/min — ABNORMAL LOW (ref >60)
Glucose, Bld: 128 mg/dL — ABNORMAL HIGH (ref 70–99)
Potassium: 4.6 mmol/L (ref 3.5–5.1)
Sodium: 140 mmol/L (ref 135–145)

## 2022-09-14 NOTE — Telephone Encounter (Signed)
Pt states that she has been taking BP 30-45 mins after taking medication. She will start taking BP 2-3 hours after taking medications and call our office on Friday with an update. If still elevated with schedule for nurse visit to check BP.

## 2022-09-14 NOTE — Telephone Encounter (Signed)
-----   Message from Erma Heritage, [place] sent at 09/14/2022 12:16 PM EST ----- Please let the patient know her electrolytes are within a normal range and kidney function has improved when compared to prior labs. Continue current medical therapy.

## 2022-09-14 NOTE — Telephone Encounter (Signed)
Pt states that her BP has not improved with medication changes. Current BP's are as follows.  159/75 60 128/63 59 149/68 61 133/67 56 155/68 56

## 2022-09-16 ENCOUNTER — Telehealth: Payer: Self-pay | Admitting: Student

## 2022-09-16 NOTE — Telephone Encounter (Signed)
Pt calling back to update with bp readings (all taken 2 hours after medication):     Yesterday morning: 150/69  hr47 Last night: 145/65  hr 55 This morning: 151/81  hr51

## 2022-09-16 NOTE — Telephone Encounter (Signed)
Pt is returning call. Transferred to [place], Marikay Alar

## 2022-09-16 NOTE — Telephone Encounter (Signed)
Pt scheduled for nurse visit on 09/19/22 at 3:45 pm

## 2022-09-16 NOTE — Telephone Encounter (Signed)
Called pt to schedule nurse appt. No answer left msg to call back.

## 2022-09-19 ENCOUNTER — Ambulatory Visit: Payer: Medicare Other | Attending: Cardiology

## 2022-09-19 DIAGNOSIS — I10 Essential (primary) hypertension: Secondary | ICD-10-CM

## 2022-09-19 NOTE — Progress Notes (Signed)
Patient came with home [employer] BP monitor which was essentially the same BP reading as what we got with manual cuff in office.  Patient says she would like to try a different BP medication at this point.   I will forward to B.Strader,PA-C for review.

## 2022-09-21 ENCOUNTER — Telehealth: Payer: Self-pay | Admitting: Student

## 2022-09-21 DIAGNOSIS — Z79899 Other long term (current) drug therapy: Secondary | ICD-10-CM

## 2022-09-21 MED ORDER — LOSARTAN POTASSIUM 50 MG PO TABS: 50.0000 mg | ORAL_TABLET | Freq: Every day | ORAL | 3 refills | Status: DC

## 2022-09-21 MED ORDER — CHLORTHALIDONE 25 MG PO TABS: 12.5000 mg | ORAL_TABLET | Freq: Every day | ORAL | 3 refills | Status: DC

## 2022-09-21 NOTE — Telephone Encounter (Signed)
Patient notified and verbalized understanding. Patient agreeable with medication changes. Will send message to schedulers to move appointment up 1-2 months per providers request.

## 2022-09-21 NOTE — Telephone Encounter (Signed)
    I apologize for the delay as I did not receive the notes from her BP check that day. Let's stop Irbesartan and try switching to Losartan '50mg'$  daily (can ultimately titrate to '100mg'$  daily). Would also stop HCTZ and switch this to Chlorthalidone 12.'5mg'$  daily (has a better impact on BP). Repeat BMET in 2 weeks. Her next visit is not until 01/2023. Let's get this moved up in 1-2 months as she may need further work-up of resistant hypertension if medication changes do not lead to an improvement in readings.   Signed, Erma Heritage, PA-C 09/21/2022, 3:46 PM

## 2022-09-21 NOTE — Telephone Encounter (Signed)
   Pt is calling back to f/u. She said, [place] were supposed to call her yesterday to changed he medications for her BP issue. She requested if someone can call her back today

## 2022-09-29 IMAGING — US US BREAST*R* LIMITED INC AXILLA
1 series · 8 of 8 positions shown · non-contrast
Comparison: Previous exam(s).

CLINICAL DATA: First six-month follow-up for probably benign RIGHT
breast mass.

EXAM:
DIGITAL DIAGNOSTIC UNILATERAL RIGHT MAMMOGRAM WITH TOMOSYNTHESIS AND
CAD; ULTRASOUND RIGHT BREAST LIMITED
TECHNIQUE: Right digital diagnostic mammography and breast tomosynthesis was
performed. The images were evaluated with computer-aided detection.;
Targeted ultrasound examination of the right breast was performed

[Series 1: us breast ltd uni right inc axilla · 8 of 8 slices shown]
[im 1/8]
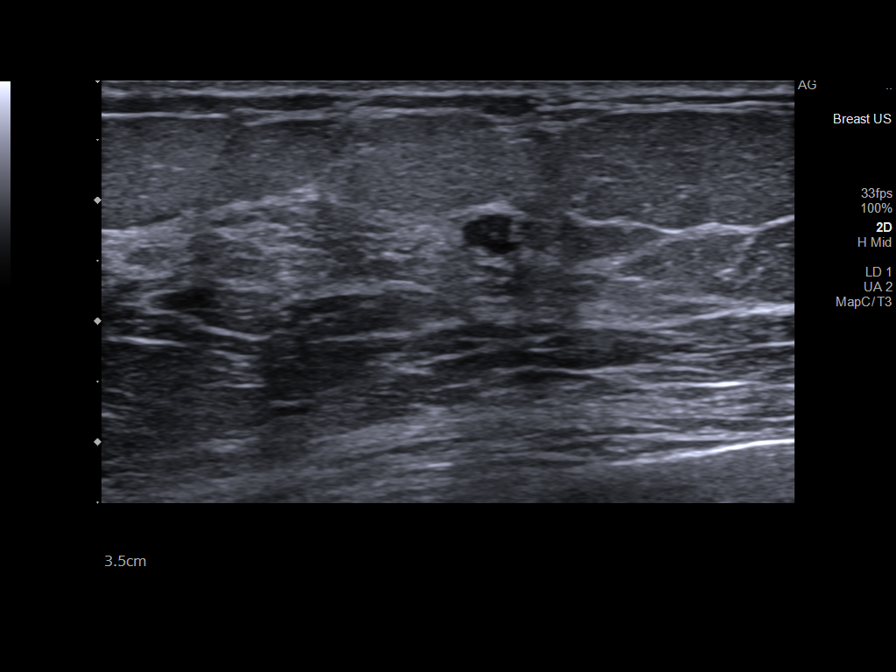
[im 2/8]
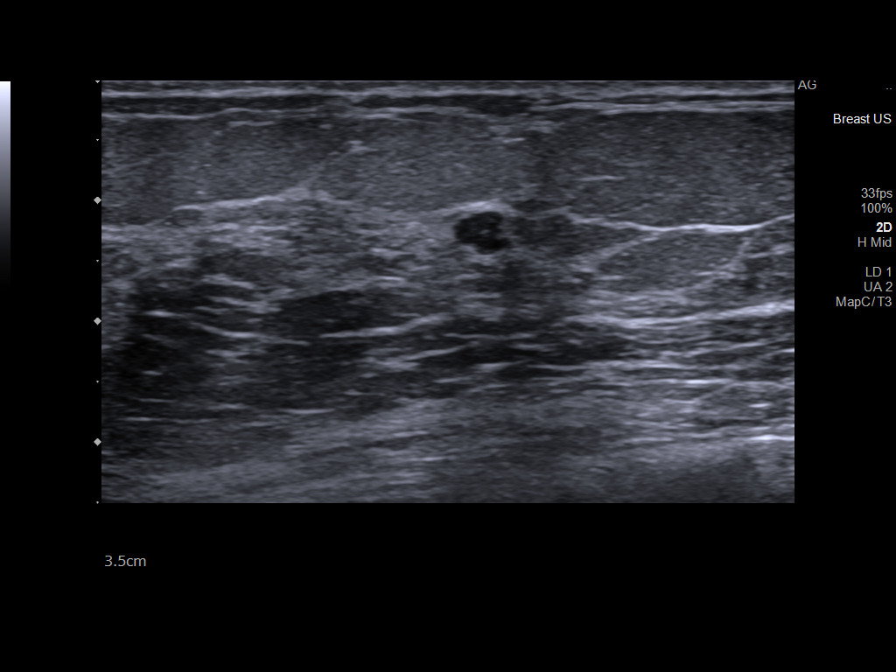
[im 3/8]
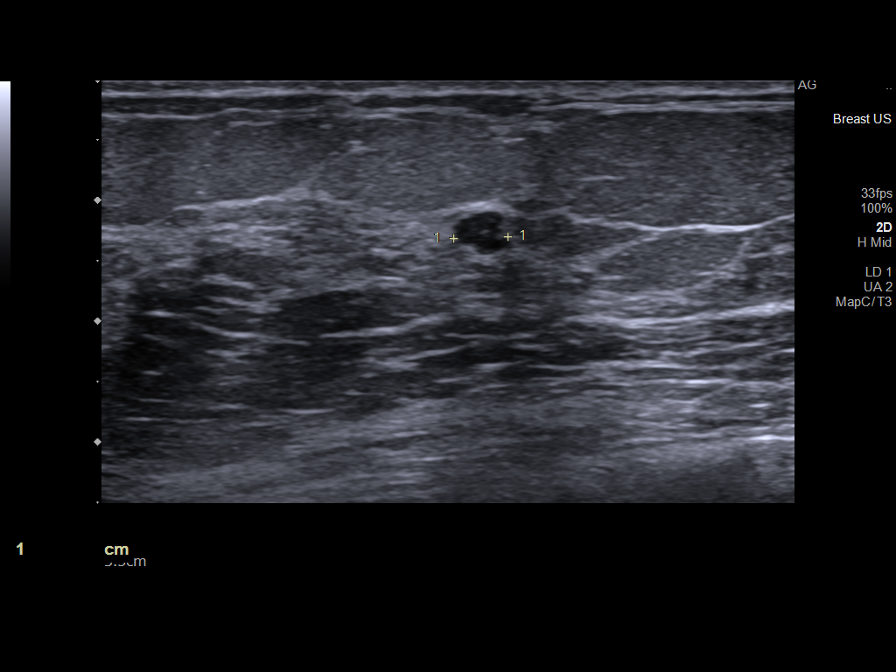
[im 4/8]
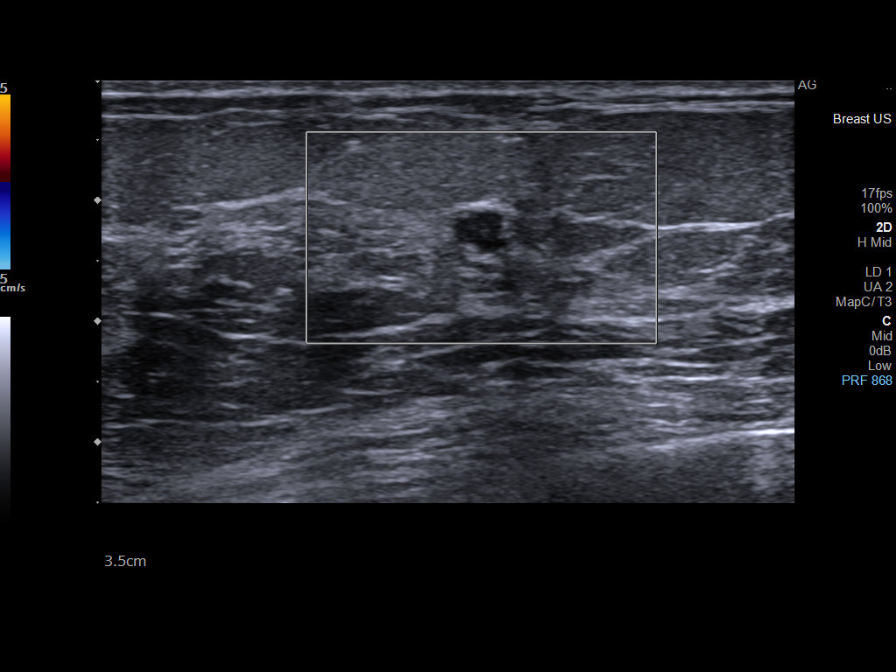
[im 5/8]
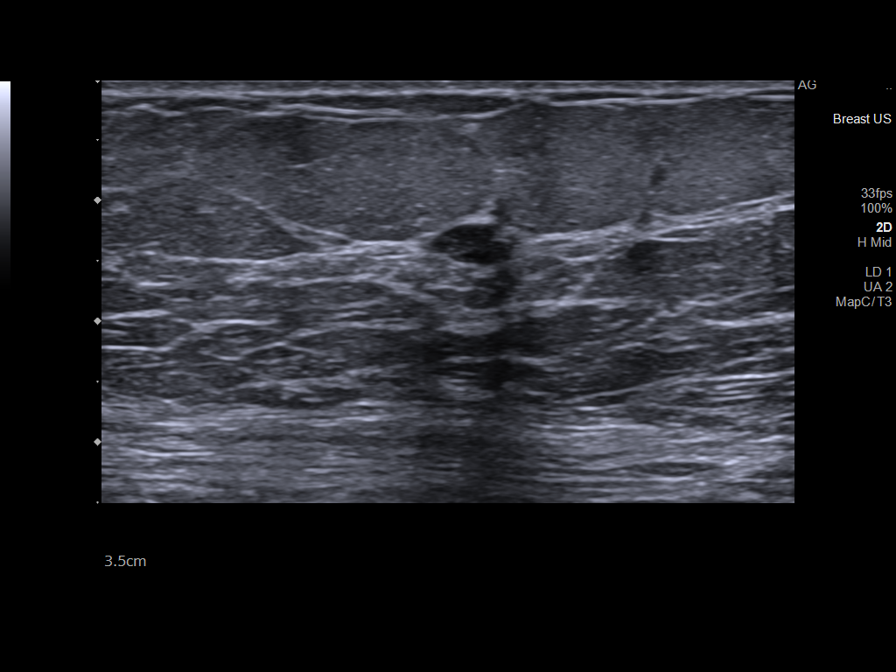
[im 6/8]
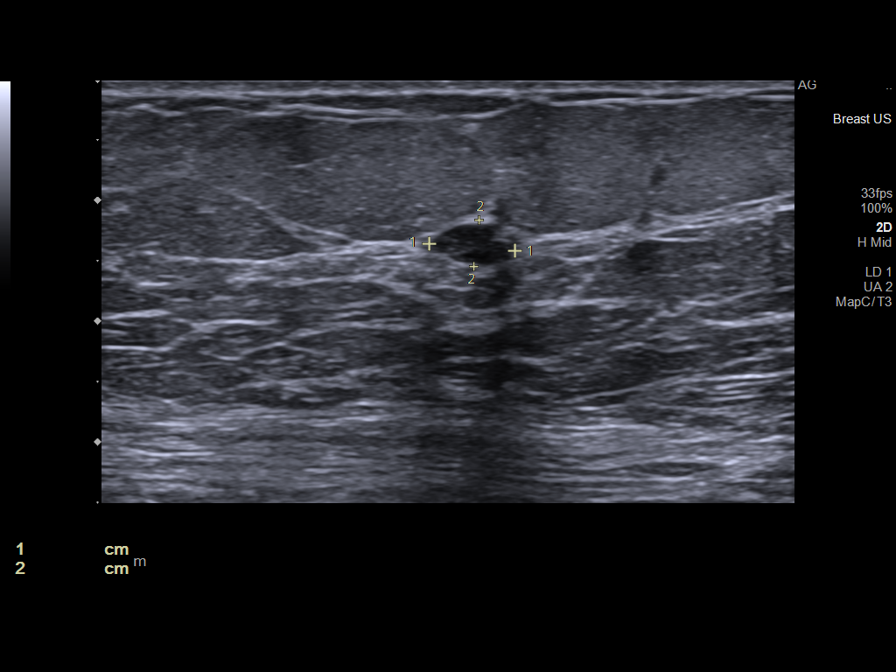
[im 7/8]
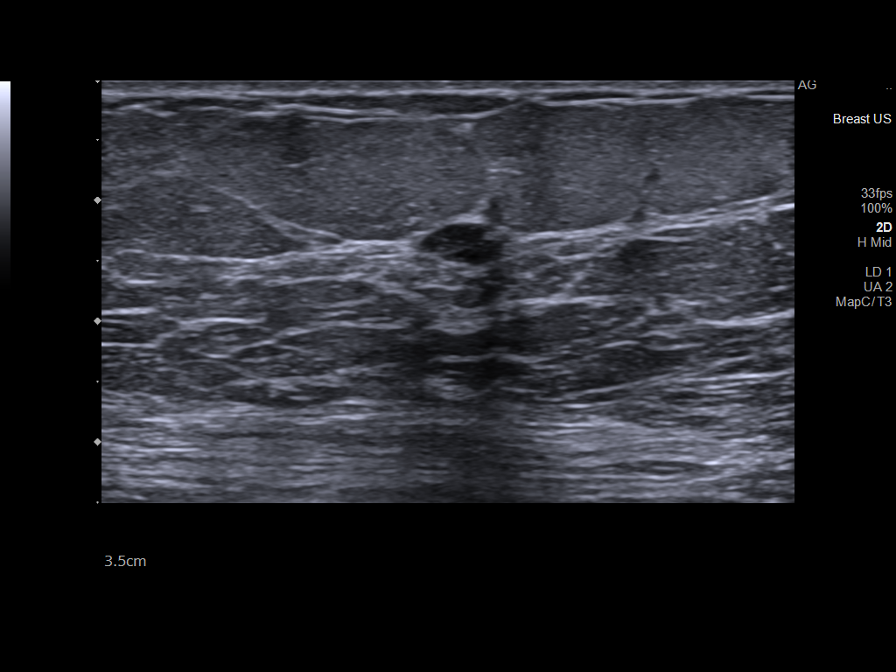
[im 8/8]
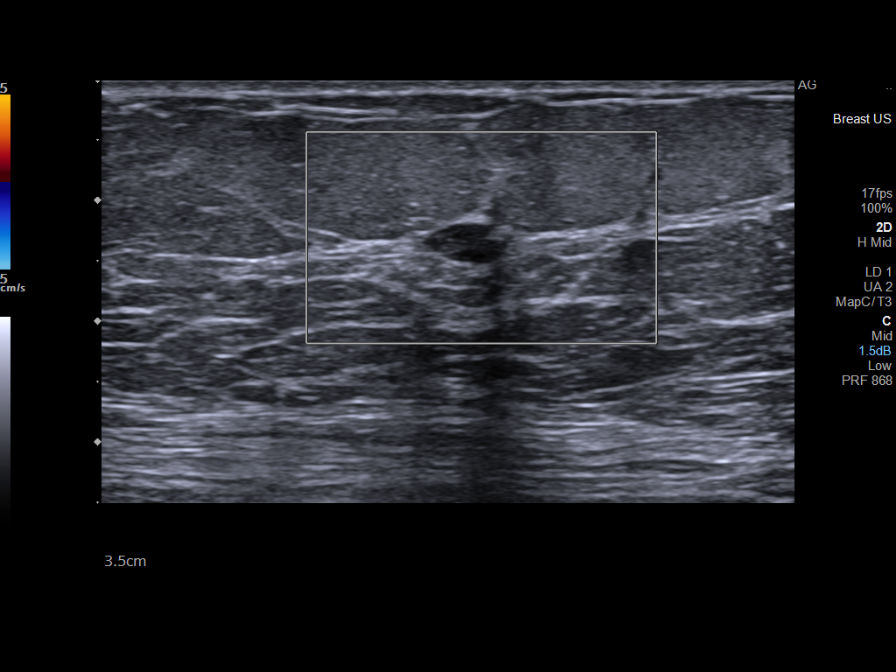

[8 of 8 positions shown; findings below may reference images not displayed]

ACR Breast Density Category b: There are scattered areas of
fibroglandular density.
FINDINGS: Additional 2-D and 3-D images are performed. These views confirm
presence of an oval circumscribed mass in the UPPER central RIGHT
breast.

Targeted ultrasound is performed, showing a circumscribed oval
hypoechoic mass with internal septations in the 12 o'clock location
RIGHT breast 4 centimeters from the nipple. Mass is 0.5 x 0.7 x 0 3
centimeters. Previously mass measured 0.6 x 0.4 x 0.8 centimeters.
IMPRESSION: Stable appearance of probably benign fibrocystic changes in the
RIGHT breast. No mammographic or ultrasound evidence for malignancy.

RECOMMENDATION:
Recommend bilateral diagnostic mammogram with RIGHT breast
ultrasound in 6 months.

I have discussed the findings and recommendations with the patient.
If applicable, a reminder letter will be sent to the patient
regarding the next appointment.

BI-RADS CATEGORY  3: Probably benign.

## 2022-09-29 IMAGING — MG MM DIGITAL DIAGNOSTIC UNILAT*R* W/ TOMO W/ CAD
6 series · 6 of 18 positions shown · non-contrast
Comparison: Previous exam(s).

CLINICAL DATA: First six-month follow-up for probably benign RIGHT
breast mass.

EXAM:
DIGITAL DIAGNOSTIC UNILATERAL RIGHT MAMMOGRAM WITH TOMOSYNTHESIS AND
CAD; ULTRASOUND RIGHT BREAST LIMITED
TECHNIQUE: Right digital diagnostic mammography and breast tomosynthesis was
performed. The images were evaluated with computer-aided detection.;
Targeted ultrasound examination of the right breast was performed

[R XCCL synth-2D]
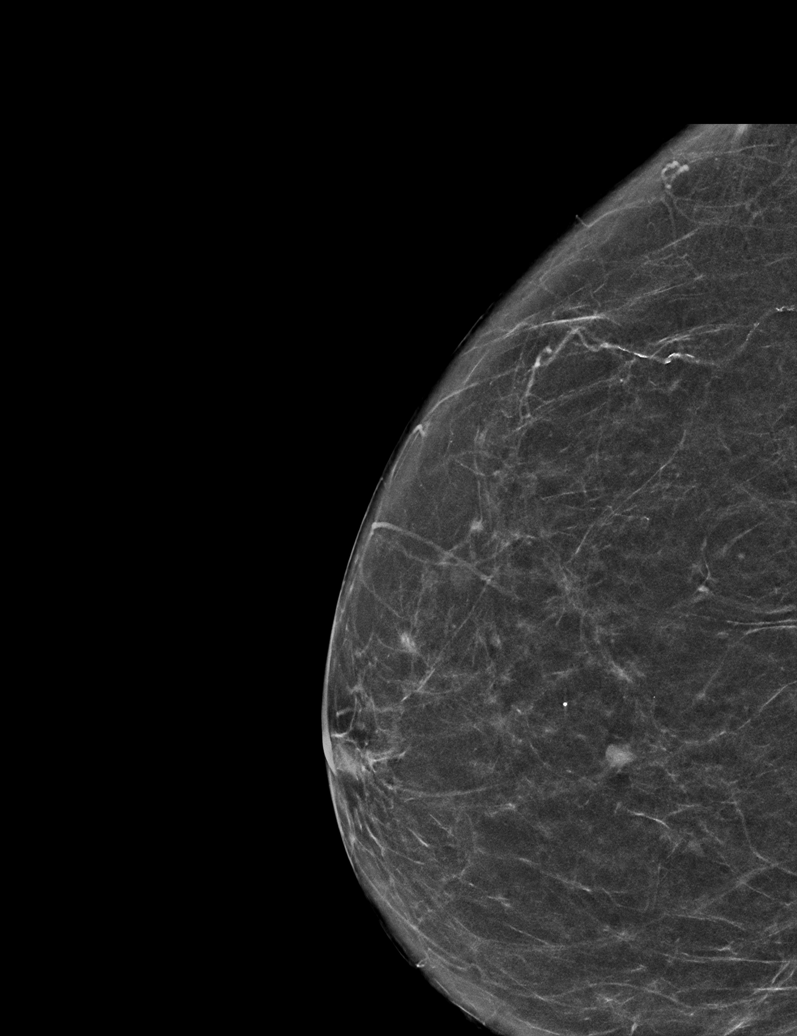

[R MLO synth-2D]
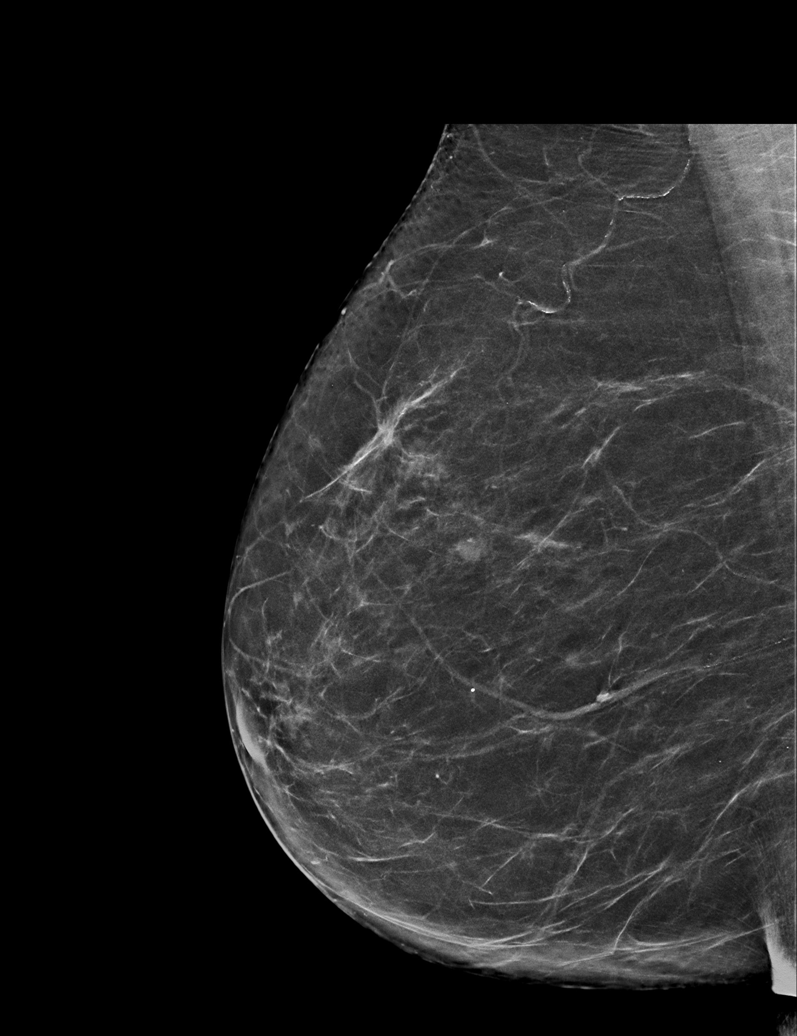

[R CC synth-2D]
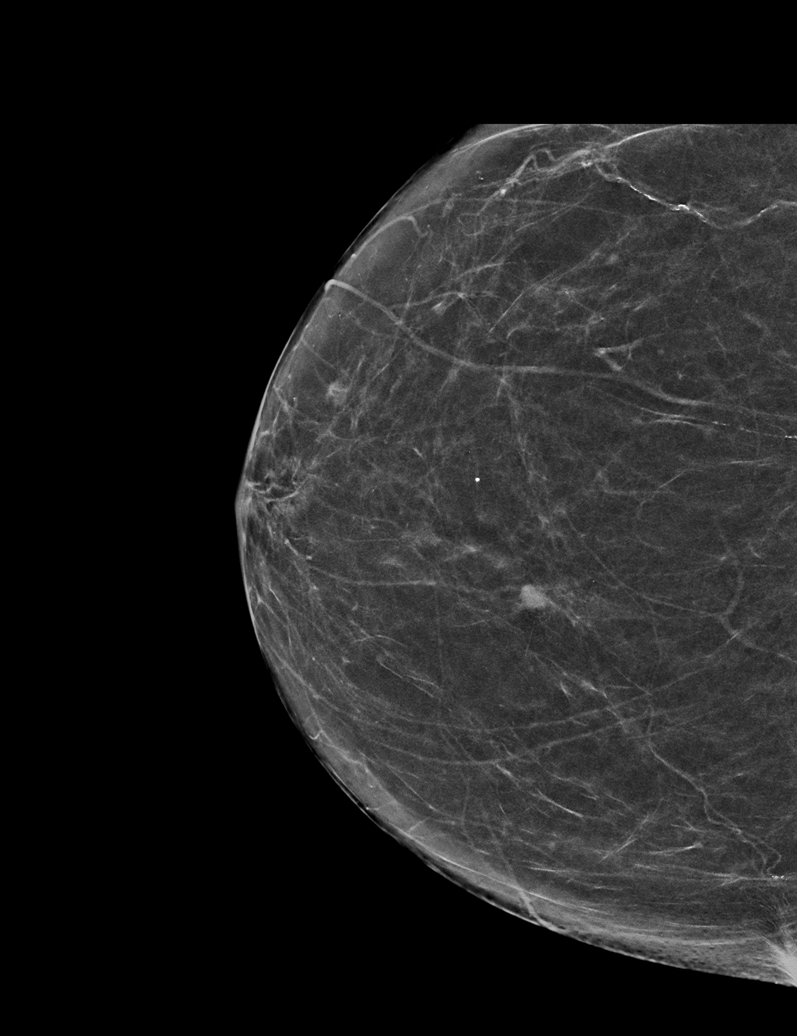

[R MLO tomo · tomo slice 33/64.0]
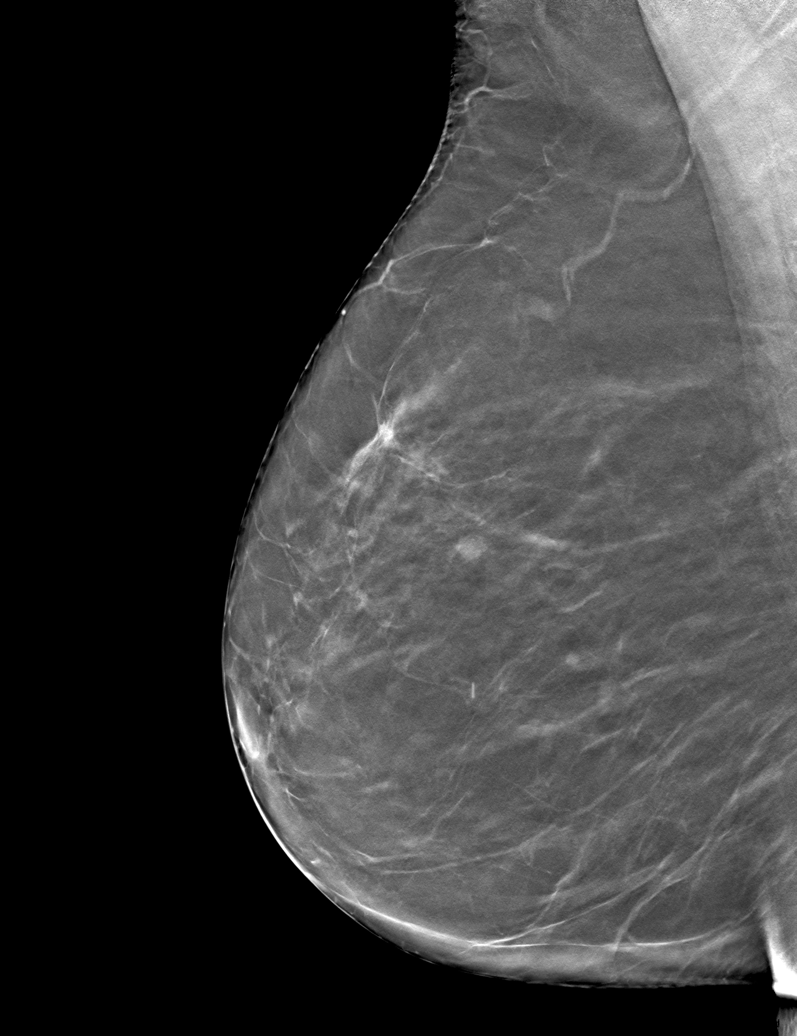

[R XCCL tomo · tomo slice 29/57.0]
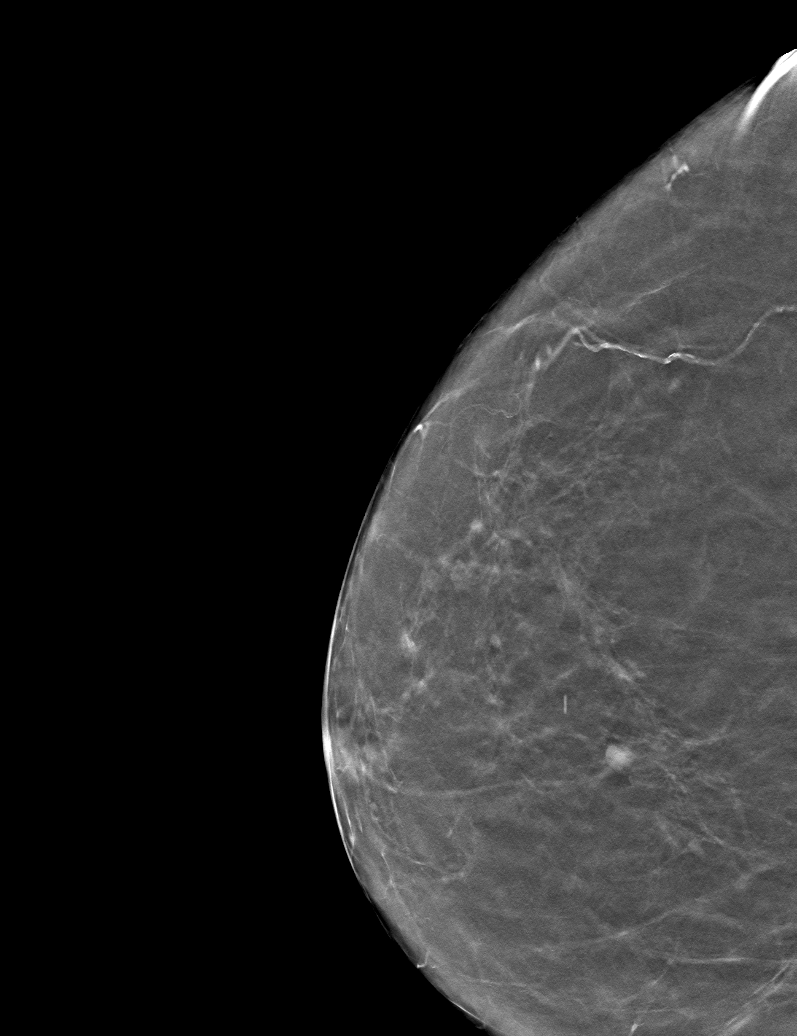

[R CC tomo · tomo slice 29/58.0]
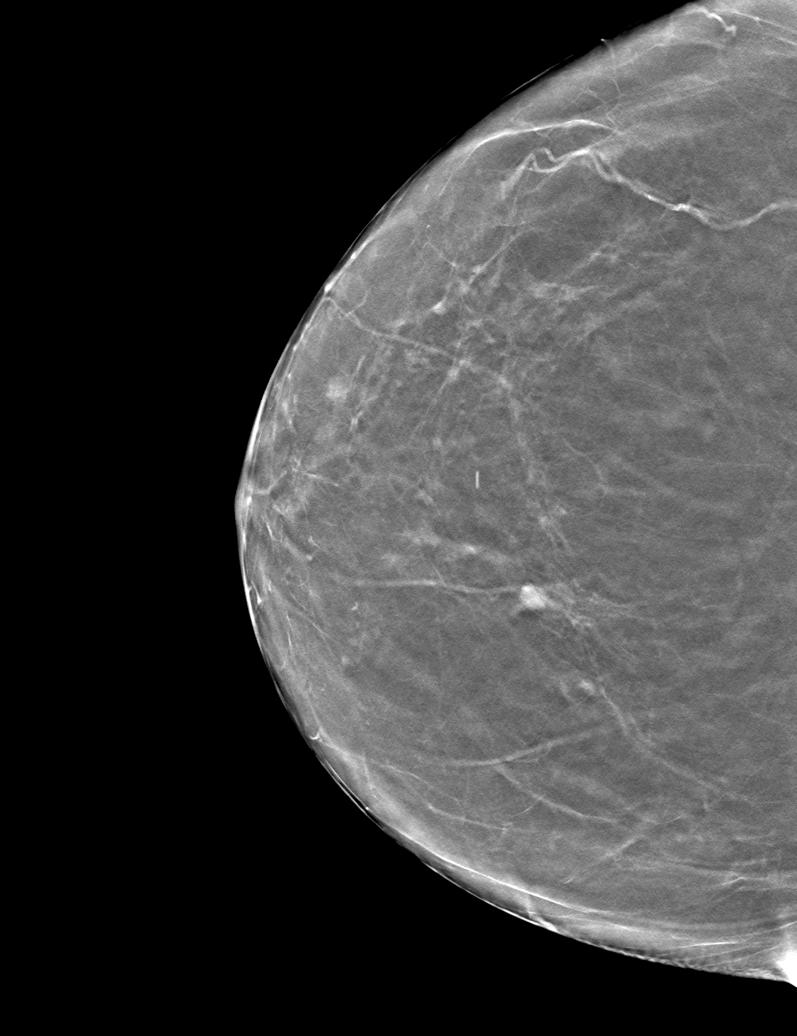

[6 of 18 positions shown; findings below may reference images not displayed]

ACR Breast Density Category b: There are scattered areas of
fibroglandular density.
FINDINGS: Additional 2-D and 3-D images are performed. These views confirm
presence of an oval circumscribed mass in the UPPER central RIGHT
breast.

Targeted ultrasound is performed, showing a circumscribed oval
hypoechoic mass with internal septations in the 12 o'clock location
RIGHT breast 4 centimeters from the nipple. Mass is 0.5 x 0.7 x 0 3
centimeters. Previously mass measured 0.6 x 0.4 x 0.8 centimeters.
IMPRESSION: Stable appearance of probably benign fibrocystic changes in the
RIGHT breast. No mammographic or ultrasound evidence for malignancy.

RECOMMENDATION:
Recommend bilateral diagnostic mammogram with RIGHT breast
ultrasound in 6 months.

I have discussed the findings and recommendations with the patient.
If applicable, a reminder letter will be sent to the patient
regarding the next appointment.

BI-RADS CATEGORY  3: Probably benign.

## 2022-10-06 DIAGNOSIS — Z79899 Other long term (current) drug therapy: Secondary | ICD-10-CM | POA: Diagnosis not present

## 2022-10-07 ENCOUNTER — Telehealth: Payer: Self-pay

## 2022-10-07 DIAGNOSIS — Z79899 Other long term (current) drug therapy: Secondary | ICD-10-CM

## 2022-10-07 LAB — BASIC METABOLIC PANEL
BUN/Creatinine Ratio: 15 (ref 12–28)
BUN: 18 mg/dL (ref 8–27)
CO2: 23 mmol/L (ref 20–29)
Calcium: 9.7 mg/dL (ref 8.7–10.3)
Chloride: 101 mmol/L (ref 96–106)
Creatinine, Ser: 1.23 mg/dL — ABNORMAL HIGH (ref 0.57–1.00)
Glucose: 139 mg/dL — ABNORMAL HIGH (ref 70–99)
Potassium: 4.3 mmol/L (ref 3.5–5.2)
Sodium: 140 mmol/L (ref 134–144)
eGFR: 46 mL/min/{1.73_m2} — ABNORMAL LOW (ref >59)

## 2022-10-07 MED ORDER — LOSARTAN POTASSIUM 100 MG PO TABS: 100.0000 mg | ORAL_TABLET | Freq: Every day | ORAL | 3 refills | Status: DC

## 2022-10-07 NOTE — Telephone Encounter (Signed)
Spoke to pt who stated that bp was as follows: 153/71 hr- 58 141/69 hr- 57 143/65 hr- 57 156/68 hr- 58  Patient stated she was tolerating medication well.   Verbalized increasing Losartan to 100 mg tablets daily with repeat lab work prior to her 10/27/21 appt w/VM. Patient voiced understanding and had no questions or concerns at this time. PCP copied.

## 2022-10-07 NOTE — Telephone Encounter (Signed)
-----   Message from Charlie Pitter, PA-C sent at 10/07/2022  7:45 AM EST ----- Covering B Strader's inbox. BMET was done to f/u med changes outlined in previous phone note. Please let pt know BMET is stable with known chronic kidney disease and elevated blood sugar similar to prior. Please find out how BP is running with the medicine changes. If SBP still consistently >140 and tolerating present regimen, would increase losartan to '100mg'$  daily and keep f/u as scheduled with Dr. Dellia Cloud 10/27/21 with BMET that day.

## 2022-10-11 DIAGNOSIS — E785 Hyperlipidemia, unspecified: Secondary | ICD-10-CM | POA: Diagnosis not present

## 2022-10-11 DIAGNOSIS — E1129 Type 2 diabetes mellitus with other diabetic kidney complication: Secondary | ICD-10-CM | POA: Diagnosis not present

## 2022-10-11 DIAGNOSIS — N1832 Chronic kidney disease, stage 3b: Secondary | ICD-10-CM | POA: Diagnosis not present

## 2022-10-11 DIAGNOSIS — I10 Essential (primary) hypertension: Secondary | ICD-10-CM | POA: Diagnosis not present

## 2022-10-11 DIAGNOSIS — K760 Fatty (change of) liver, not elsewhere classified: Secondary | ICD-10-CM | POA: Diagnosis not present

## 2022-10-18 DIAGNOSIS — I10 Essential (primary) hypertension: Secondary | ICD-10-CM | POA: Diagnosis not present

## 2022-10-18 DIAGNOSIS — E785 Hyperlipidemia, unspecified: Secondary | ICD-10-CM | POA: Diagnosis not present

## 2022-10-18 DIAGNOSIS — R7309 Other abnormal glucose: Secondary | ICD-10-CM | POA: Diagnosis not present

## 2022-10-18 DIAGNOSIS — E1122 Type 2 diabetes mellitus with diabetic chronic kidney disease: Secondary | ICD-10-CM | POA: Diagnosis not present

## 2022-10-18 DIAGNOSIS — N1832 Chronic kidney disease, stage 3b: Secondary | ICD-10-CM | POA: Diagnosis not present

## 2022-10-26 ENCOUNTER — Other Ambulatory Visit (HOSPITAL_COMMUNITY)
Admission: RE | Admit: 2022-10-26 | Discharge: 2022-10-26 | Disposition: A | Discharge status: Home / Self Care | Payer: Medicare Other | Source: Ambulatory Visit | Attending: Physician Assistant | Admitting: Physician Assistant

## 2022-10-26 DIAGNOSIS — Z79899 Other long term (current) drug therapy: Secondary | ICD-10-CM | POA: Diagnosis not present

## 2022-10-26 LAB — BASIC METABOLIC PANEL
Anion gap: 9 (ref 5–15)
BUN: 29 mg/dL — ABNORMAL HIGH (ref 8–23)
CO2: 24 mmol/L (ref 22–32)
Calcium: 9.4 mg/dL (ref 8.9–10.3)
Chloride: 104 mmol/L (ref 98–111)
Creatinine, Ser: 1.33 mg/dL — ABNORMAL HIGH (ref 0.44–1.00)
GFR, Estimated: 42 mL/min — ABNORMAL LOW (ref >60)
Glucose, Bld: 117 mg/dL — ABNORMAL HIGH (ref 70–99)
Potassium: 4.3 mmol/L (ref 3.5–5.1)
Sodium: 137 mmol/L (ref 135–145)

## 2022-10-27 ENCOUNTER — Ambulatory Visit: Payer: Medicare Other | Attending: Internal Medicine | Admitting: Internal Medicine

## 2022-10-27 ENCOUNTER — Encounter: Payer: Self-pay | Admitting: Internal Medicine

## 2022-10-27 VITALS — BP 188/75 | HR 58 | Ht 62.0 in | Wt 182.0 lb

## 2022-10-27 DIAGNOSIS — E7849 Other hyperlipidemia: Secondary | ICD-10-CM | POA: Diagnosis not present

## 2022-10-27 DIAGNOSIS — E785 Hyperlipidemia, unspecified: Secondary | ICD-10-CM | POA: Insufficient documentation

## 2022-10-27 MED ORDER — CHLORTHALIDONE 25 MG PO TABS: 25.0000 mg | ORAL_TABLET | Freq: Every day | ORAL | 3 refills | Status: AC

## 2022-10-27 NOTE — Progress Notes (Signed)
Cardiology Office Note  Date: 10/27/2022   ID: Jennifer Morrison, DOB Jul 03, 1946, MRN [id number]  PCP:  Asencion Noble, MD  Cardiologist:  Chalmers Guest, MD Electrophysiologist:  None   Reason for Office Visit: Follow-up of A-fib   History of Present Illness: Jennifer Morrison is a 77 y.o. female known to have paroxysmal A-fib on [facility], HTN, DM2, HLD presented to the cardiology clinic for follow-up visit.  Patient was admitted at [facility] in 03/2022 for recurrent A-fib with RVR (previously had ER visit in 02/2022 with A-fib and spontaneously converted to NSR) and was discharged on Cardizem, metoprolol and Eliquis. Plan was initially to schedule for outpatient DCCV but she spontaneously converted to NSR during the preoperative anesthesia appointment.  During one of the APP's clinic appointments, patient was noted to have HR 30s with fatigue due to which she underwent event monitor with 12 SVT episodes and average heart rate 89 bpm for which she was referred to EP with Dr. Lovena Le on 05/2022. Dr. Lovena Le recommended no indication for ablation or PPM placement and continued current medical therapy. She presented today for follow-up visit.  Denied any palpitations, dizziness, lightness, syncope, angina, DOE.  Past Medical History:  Diagnosis Date   Actinic keratosis 08/23/2018   L lat calf - bx proven   Actinic keratosis 06/24/2021   R med thigh - bx proven   Basal cell carcinoma 09/12/2019   R nasal facial angle near eyelid   Diabetes mellitus without complication (HCC)    NIDDM x 1 yr   GERD (gastroesophageal reflux disease)    Hypercholesteremia    Hypertension    PAF (paroxysmal atrial fibrillation) ([place])    Uterine cancer ([place]) 08/14/2016    Past Surgical History:  Procedure Laterality Date   ABDOMINAL HYSTERECTOMY     CATARACT EXTRACTION W/PHACO Right 06/04/2018   Procedure: CATARACT EXTRACTION PHACO AND INTRAOCULAR LENS PLACEMENT RIGHT EYE CDE=7.64;  Surgeon: Tonny Branch, MD;   Location: [facility];  Service: Ophthalmology;  Laterality: Right;  right   CATARACT EXTRACTION W/PHACO Left 07/09/2018   Procedure: CATARACT EXTRACTION PHACO AND INTRAOCULAR LENS PLACEMENT (IOC);  Surgeon: Tonny Branch, MD;  Location: [facility];  Service: Ophthalmology;  Laterality: Left;  CDE: 5.53   COLONOSCOPY  2005   COLONOSCOPY N/A 11/28/2013   Procedure: COLONOSCOPY;  Surgeon: Rogene Houston, MD;  Location: [facility] SU[facility];  Service: Endoscopy;  Laterality: N/A;  830   Polyp removed from uterus     ROBOTIC ASSISTED LAP VAGINAL HYSTERECTOMY N/A 09/06/2016   Procedure: XI ROBOTIC ASSISTED LAPAROSCOPIC TOTAL HYSTERECTOMY;  Surgeon: Everitt Amber, MD;  Location: [facility];  Service: Gynecology;  Laterality: N/A;   ROBOTIC ASSISTED SALPINGO OOPHERECTOMY Bilateral 09/06/2016   Procedure: XI ROBOTIC ASSISTED SALPINGO OOPHORECTOMY;  Surgeon: Everitt Amber, MD;  Location: [facility];  Service: Gynecology;  Laterality: Bilateral;   SENTINEL NODE BIOPSY N/A 09/06/2016   Procedure: SENTINEL NODE BIOPSY;  Surgeon: Everitt Amber, MD;  Location: [facility];  Service: Gynecology;  Laterality: N/A;    Current Outpatient Medications  Medication Sig Dispense Refill   apixaban (ELIQUIS) 5 MG TABS tablet Take 1 tablet (5 mg total) by mouth 2 (two) times daily. 180 tablet 3   diltiazem (CARDIZEM CD) 120 MG 24 hr capsule Take 1 capsule (120 mg total) by mouth daily. 90 capsule 3   losartan (COZAAR) 100 MG tablet Take 1 tablet (100 mg total) by mouth daily. 90 tablet 3   metFORMIN (GLUCOPHAGE) 500 MG tablet Take  500 mg by mouth 2 (two) times daily.      metoprolol tartrate (LOPRESSOR) 25 MG tablet Take 1 tablet (25 mg total) by mouth 2 (two) times daily. 180 tablet 3   omeprazole (PRILOSEC) 20 MG capsule Take 20 mg by mouth daily.     pravastatin (PRAVACHOL) 20 MG tablet Take 20 mg by mouth every evening.      chlorthalidone (HYGROTON) 25 MG tablet Take 1 tablet (25 mg total) by mouth daily. 90 tablet 3   No current facility-administered  medications for this visit.   Allergies:  Patient has no known allergies.   Social History: The patient  reports that she has never smoked. She has never used smokeless tobacco. She reports that she does not drink alcohol and does not use drugs.   Family History: The patient's family history includes Diabetes in her brother and father.   ROS:  Please see the history of present illness. Otherwise, complete review of systems is positive for none.  All other systems are reviewed and negative.   Physical Exam: VS:  BP (!) 188/75   Pulse (!) 58   Ht '5\' 2"'$  (1.575 m)   Wt 182 lb (82.6 kg)   SpO2 99%   BMI 33.29 kg/m , BMI Body mass index is 33.29 kg/m.  Wt Readings from Last 3 Encounters:  10/27/22 182 lb (82.6 kg)  07/22/22 178 lb 9.6 oz (81 kg)  05/12/22 174 lb 3.2 oz (79 kg)    General: Patient appears comfortable at rest. HEENT: Conjunctiva and lids normal, oropharynx clear with moist mucosa. Neck: Supple, no elevated JVP or carotid bruits, no thyromegaly. Lungs: Clear to auscultation, nonlabored breathing at rest. Cardiac: Regular rate and rhythm, no S3 or significant systolic murmur, no pericardial rub. Abdomen: Soft, nontender, no hepatomegaly, bowel sounds present, no guarding or rebound. Extremities: No pitting edema, distal pulses 2+. Skin: Warm and dry. Musculoskeletal: No kyphosis. Neuropsychiatric: Alert and oriented x3, affect grossly appropriate.  ECG:  An ECG dated 04/2022 was personally reviewed today and demonstrated:  Normal sinus rhythm  Recent Labwork: 03/20/2022: ALT 18; AST 21; B Natriuretic Peptide 210.0; TSH 1.923 03/21/2022: Hemoglobin 12.9; Magnesium 2.2; Platelets 379 10/26/2022: BUN 29; Creatinine, Ser 1.33; Potassium 4.3; Sodium 137  No results found for: "CHOL", "TRIG", "HDL", "CHOLHDL", "VLDL", "LDLCALC", "LDLDIRECT"  Other Studies Reviewed Today:   Assessment and Plan: Patient is a 77 year old F known to have paroxysmal A-fib on AC, HTN, DM2, HLD  presented to the cardiology clinic for follow-up visit.  # Paroxysmal A-fib -Continue Cardizem 120 mg once daily -Continue metoprolol tartrate 25 mg twice daily -Continue Eliquis 5 mg twice daily  # HTN, partially controlled -Continue losartan 100 mg once daily -Increase chlorthalidone from 12.5 mg to 25 mg once daily -Continue diltiazem 120 mg once daily and metoprolol tartrate 25 mg twice daily  # HLD -Continue pravastatin 20 mg nightly.  No myalgias.  I have spent a total of 33 minutes with patient reviewing chart, EKGs, labs and examining patient as well as establishing an assessment and plan that was discussed with the patient.  > 50% of time was spent in direct patient care.  '  Medication Adjustments/Labs and Tests Ordered: Current medicines are reviewed at length with the patient today.  Concerns regarding medicines are outlined above.   Tests Ordered: No orders of the defined types were placed in this encounter.   Medication Changes: Meds ordered this encounter  Medications   chlorthalidone (HYGROTON) 25 MG tablet  Sig: Take 1 tablet (25 mg total) by mouth daily.    Dispense:  90 tablet    Refill:  3    Disposition:  Follow up  6 months  Signed, Afrah Burlison Fidel Levy, MD, 10/27/2022 3:18 PM    [facility] [address], [place], [place] [address]

## 2022-10-27 NOTE — Patient Instructions (Signed)
Medication Instructions:  Your physician has recommended you make the following change in your medication:  - Increase Chlorthalidone to 25 mg tablets once daily   Labwork: None  Testing/Procedures: None  Follow-Up: Follow up with Dr. Dellia Cloud in 6 months.   Any Other Special Instructions Will Be Listed Below (If Applicable).     If you need a refill on your cardiac medications before your next appointment, please call your pharmacy.

## 2023-01-10 DIAGNOSIS — N1832 Chronic kidney disease, stage 3b: Secondary | ICD-10-CM | POA: Diagnosis not present

## 2023-01-10 DIAGNOSIS — E1129 Type 2 diabetes mellitus with other diabetic kidney complication: Secondary | ICD-10-CM | POA: Diagnosis not present

## 2023-01-10 DIAGNOSIS — I10 Essential (primary) hypertension: Secondary | ICD-10-CM | POA: Diagnosis not present

## 2023-01-10 DIAGNOSIS — Z79899 Other long term (current) drug therapy: Secondary | ICD-10-CM | POA: Diagnosis not present

## 2023-01-12 DIAGNOSIS — E119 Type 2 diabetes mellitus without complications: Secondary | ICD-10-CM | POA: Diagnosis not present

## 2023-01-17 DIAGNOSIS — I480 Paroxysmal atrial fibrillation: Secondary | ICD-10-CM | POA: Diagnosis not present

## 2023-01-17 DIAGNOSIS — N1832 Chronic kidney disease, stage 3b: Secondary | ICD-10-CM | POA: Diagnosis not present

## 2023-01-17 DIAGNOSIS — E1122 Type 2 diabetes mellitus with diabetic chronic kidney disease: Secondary | ICD-10-CM | POA: Diagnosis not present

## 2023-01-17 DIAGNOSIS — I10 Essential (primary) hypertension: Secondary | ICD-10-CM | POA: Diagnosis not present

## 2023-01-17 DIAGNOSIS — R7309 Other abnormal glucose: Secondary | ICD-10-CM | POA: Diagnosis not present

## 2023-02-06 ENCOUNTER — Ambulatory Visit: Payer: Medicare Other | Admitting: Internal Medicine

## 2023-04-12 DIAGNOSIS — I10 Essential (primary) hypertension: Secondary | ICD-10-CM | POA: Diagnosis not present

## 2023-04-12 DIAGNOSIS — E785 Hyperlipidemia, unspecified: Secondary | ICD-10-CM | POA: Diagnosis not present

## 2023-04-12 DIAGNOSIS — K760 Fatty (change of) liver, not elsewhere classified: Secondary | ICD-10-CM | POA: Diagnosis not present

## 2023-04-12 DIAGNOSIS — E1129 Type 2 diabetes mellitus with other diabetic kidney complication: Secondary | ICD-10-CM | POA: Diagnosis not present

## 2023-04-12 DIAGNOSIS — N1832 Chronic kidney disease, stage 3b: Secondary | ICD-10-CM | POA: Diagnosis not present

## 2023-04-19 DIAGNOSIS — E1122 Type 2 diabetes mellitus with diabetic chronic kidney disease: Secondary | ICD-10-CM | POA: Diagnosis not present

## 2023-04-19 DIAGNOSIS — R7309 Other abnormal glucose: Secondary | ICD-10-CM | POA: Diagnosis not present

## 2023-04-19 DIAGNOSIS — I10 Essential (primary) hypertension: Secondary | ICD-10-CM | POA: Diagnosis not present

## 2023-04-19 DIAGNOSIS — I480 Paroxysmal atrial fibrillation: Secondary | ICD-10-CM | POA: Diagnosis not present

## 2023-04-21 ENCOUNTER — Other Ambulatory Visit: Payer: Self-pay | Admitting: Cardiology

## 2023-04-21 NOTE — Telephone Encounter (Signed)
Prescription refill request for Eliquis received. Indication:afib Last office visit:1/24 Scr:1.33  1/24 Age: 77 Weight:82.6  kg  Prescription refilled

## 2023-04-28 ENCOUNTER — Ambulatory Visit: Payer: Medicare Other | Attending: Internal Medicine | Admitting: Internal Medicine

## 2023-04-28 ENCOUNTER — Encounter: Payer: Self-pay | Admitting: Internal Medicine

## 2023-04-28 ENCOUNTER — Ambulatory Visit: Payer: Medicare Other | Admitting: Internal Medicine

## 2023-04-28 VITALS — BP 164/70 | HR 56 | Ht 62.0 in | Wt 183.6 lb

## 2023-04-28 DIAGNOSIS — I480 Paroxysmal atrial fibrillation: Secondary | ICD-10-CM | POA: Diagnosis not present

## 2023-04-28 MED ORDER — DILTIAZEM HCL ER COATED BEADS 180 MG PO CP24: 180.0000 mg | ORAL_CAPSULE | Freq: Every day | ORAL | 2 refills | Status: DC

## 2023-04-28 MED ORDER — CARVEDILOL 3.125 MG PO TABS: 3.1250 mg | ORAL_TABLET | Freq: Two times a day (BID) | ORAL | 2 refills | Status: DC

## 2023-04-28 NOTE — Progress Notes (Signed)
Cardiology Office Note  Date: 04/28/2023   ID: Jennifer Morrison, DOB 02-26-1946, MRN [id number]  PCP:  Carylon Perches, MD  Cardiologist:  Marjo Bicker, MD Electrophysiologist:  None   Reason for Office Visit: Follow-up of A-fib   History of Present Illness: Jennifer Morrison is a 77 y.o. female known to have paroxysmal A-fib on [facility], HTN, DM2, HLD presented to the cardiology clinic for follow-up visit.  Patient was admitted at [facility] in 03/2022 for recurrent A-fib with RVR (previously had ER visit in 02/2022 with A-fib and spontaneously converted to NSR) and was discharged on Cardizem, metoprolol and Eliquis. Plan was initially to schedule for outpatient DCCV but she spontaneously converted to NSR during the preoperative anesthesia appointment.  During one of the APP's clinic appointments, patient was noted to have HR 30s with fatigue due to which she underwent event monitor with 12 SVT episodes and average heart rate 89 bpm for which she was referred to EP with Dr. Ladona Ridgel on 05/2022. Dr. Ladona Ridgel recommended no indication for ablation or PPM placement and continued current medical therapy. She presented today for follow-up visit.    Patient is here for follow-up visit with me. No interval ER visits or hospitalizations. Patient denied any rest or exertional chest discomfort, tightness, heaviness or pressure, rest or exertional dyspnea, palpitations, light-headedness, syncope and LE swelling. Compliant with medications and no side-effects. No bleeding complications.   Past Medical History:  Diagnosis Date   Actinic keratosis 08/23/2018   L lat calf - bx proven   Actinic keratosis 06/24/2021   R med thigh - bx proven   Basal cell carcinoma 09/12/2019   R nasal facial angle near eyelid   Diabetes mellitus without complication (HCC)    NIDDM x 1 yr   GERD (gastroesophageal reflux disease)    Hypercholesteremia    Hypertension    PAF (paroxysmal atrial fibrillation) (HCC)    Uterine cancer  (HCC) 08/14/2016    Past Surgical History:  Procedure Laterality Date   ABDOMINAL HYSTERECTOMY     CATARACT EXTRACTION W/PHACO Right 06/04/2018   Procedure: CATARACT EXTRACTION PHACO AND INTRAOCULAR LENS PLACEMENT RIGHT EYE CDE=7.64;  Surgeon: Gemma Payor, MD;  Location: [facility];  Service: Ophthalmology;  Laterality: Right;  right   CATARACT EXTRACTION W/PHACO Left 07/09/2018   Procedure: CATARACT EXTRACTION PHACO AND INTRAOCULAR LENS PLACEMENT (IOC);  Surgeon: Gemma Payor, MD;  Location: [facility];  Service: Ophthalmology;  Laterality: Left;  CDE: 5.53   COLONOSCOPY  2005   COLONOSCOPY N/A 11/28/2013   Procedure: COLONOSCOPY;  Surgeon: Malissa Hippo, MD;  Location: [facility];  Service: Endoscopy;  Laterality: N/A;  830   Polyp removed from uterus     ROBOTIC ASSISTED LAP VAGINAL HYSTERECTOMY N/A 09/06/2016   Procedure: XI ROBOTIC ASSISTED LAPAROSCOPIC TOTAL HYSTERECTOMY;  Surgeon: Adolphus Birchwood, MD;  Location: [facility];  Service: Gynecology;  Laterality: N/A;   ROBOTIC ASSISTED SALPINGO OOPHERECTOMY Bilateral 09/06/2016   Procedure: XI ROBOTIC ASSISTED SALPINGO OOPHORECTOMY;  Surgeon: Adolphus Birchwood, MD;  Location: [facility];  Service: Gynecology;  Laterality: Bilateral;   SENTINEL NODE BIOPSY N/A 09/06/2016   Procedure: SENTINEL NODE BIOPSY;  Surgeon: Adolphus Birchwood, MD;  Location: [facility];  Service: Gynecology;  Laterality: N/A;    Current Outpatient Medications  Medication Sig Dispense Refill   chlorthalidone (HYGROTON) 25 MG tablet Take 1 tablet (25 mg total) by mouth daily. 90 tablet 3   diltiazem (CARDIZEM CD) 120 MG 24 hr capsule Take 1 capsule (  120 mg total) by mouth daily. 90 capsule 3   ELIQUIS 5 MG TABS tablet TAKE 1 TABLET(5 MG) BY MOUTH TWICE DAILY 180 tablet 3   losartan (COZAAR) 100 MG tablet Take 1 tablet (100 mg total) by mouth daily. 90 tablet 3   metFORMIN (GLUCOPHAGE) 500 MG tablet Take 500 mg by mouth 2 (two) times daily.      metoprolol tartrate (LOPRESSOR) 25 MG tablet Take 1  tablet (25 mg total) by mouth 2 (two) times daily. 180 tablet 3   omeprazole (PRILOSEC) 20 MG capsule Take 20 mg by mouth daily.     pravastatin (PRAVACHOL) 20 MG tablet Take 20 mg by mouth every evening.      No current facility-administered medications for this visit.   Allergies:  Patient has no known allergies.   Social History: The patient  reports that she has never smoked. She has never used smokeless tobacco. She reports that she does not drink alcohol and does not use drugs.   Family History: The patient's family history includes Diabetes in her brother and father.   ROS:  Please see the history of present illness. Otherwise, complete review of systems is positive for none.  All other systems are reviewed and negative.   Physical Exam: VS:  Ht 5\' 2"  (1.575 m)   Wt 183 lb 9.6 oz (83.3 kg)   BMI 33.58 kg/m , BMI Body mass index is 33.58 kg/m.  Wt Readings from Last 3 Encounters:  04/28/23 183 lb 9.6 oz (83.3 kg)  10/27/22 182 lb (82.6 kg)  07/22/22 178 lb 9.6 oz (81 kg)    General: Patient appears comfortable at rest. HEENT: Conjunctiva and lids normal, oropharynx clear with moist mucosa. Neck: Supple, no elevated JVP or carotid bruits, no thyromegaly. Lungs: Clear to auscultation, nonlabored breathing at rest. Cardiac: Regular rate and rhythm, no S3 or significant systolic murmur, no pericardial rub. Abdomen: Soft, nontender, no hepatomegaly, bowel sounds present, no guarding or rebound. Extremities: No pitting edema, distal pulses 2+. Skin: Warm and dry. Musculoskeletal: No kyphosis. Neuropsychiatric: Alert and oriented x3, affect grossly appropriate.  ECG:  An ECG dated 04/2022 was personally reviewed today and demonstrated:  Normal sinus rhythm  Recent Labwork: 10/26/2022: BUN 29; Creatinine, Ser 1.33; Potassium 4.3; Sodium 137  No results found for: "CHOL", "TRIG", "HDL", "CHOLHDL", "VLDL", "LDLCALC", "LDLDIRECT"  Other Studies Reviewed Today:   Assessment and  Plan: Patient is a 77 year old F known to have paroxysmal A-fib on AC, HTN, DM2, HLD presented to the cardiology clinic for follow-up visit.  # Paroxysmal A-fib -EKG today shows NSR -Decrease Diltiazem from 240 mg to 180 mg once daily (due to HR 40-50s) and switch Metoprolol to Carvedilol 3.125 mg BID for adequate HTN control. -Continue Eliquis 5 mg BID  # HTN, partially controlled -Decrease Diltiazem from 240 mg to 180 mg once daily (due to HR 40-50s) and switch Metoprolol to Carvedilol 3.125 mg BID for adequate HTN control. -Continue Chlorthalidone 25 mg once daily and Losartan 100 mg once daily.  # Type 2 DM - Currently on Jardiance. Pt would benefit from [facility] or Ozempic  that was weight loss advantage which would help with HTN control and preventing Afib recurrences. Instructed her to talk to her PCP in her office visit.  # HLD -Continue pravastatin 20 mg at bedtime. No myalgias.  I have spent a total of 33 minutes with patient reviewing chart, EKGs, labs and examining patient as well as establishing an assessment and plan that  was discussed with the patient.  > 50% of time was spent in direct patient care.  '  Medication Adjustments/Labs and Tests Ordered: Current medicines are reviewed at length with the patient today.  Concerns regarding medicines are outlined above.   Tests Ordered: Orders Placed This Encounter  Procedures   EKG 12-Lead    Medication Changes: No orders of the defined types were placed in this encounter.   Disposition:  Follow up  1 year  Signed, Tuwanna Krausz Verne Spurr, MD, 04/28/2023 11:10 AM    [facility] at [facility] [address] [place], [place] [address]

## 2023-04-28 NOTE — Patient Instructions (Addendum)
Medication Instructions:  Your physician has recommended you make the following change in your medication:  Stop metoprolol Start carvedilol 3.125 mg twice daily Decrease diltiazem to 180 mg daily Continue all other medications as prescribed  Labwork: none  Testing/Procedures: none  Follow-Up: Your physician recommends that you schedule a follow-up appointment in: 1 year. You will receive a reminder call in about 10 months reminding you to schedule your appointment. If you don't receive this call, please contact our office.  Any Other Special Instructions Will Be Listed Below (If Applicable).  If you need a refill on your cardiac medications before your next appointment, please call your pharmacy.

## 2023-06-07 ENCOUNTER — Encounter: Payer: Medicare Other | Admitting: Dermatology

## 2023-07-12 DIAGNOSIS — I480 Paroxysmal atrial fibrillation: Secondary | ICD-10-CM | POA: Diagnosis not present

## 2023-07-12 DIAGNOSIS — N1832 Chronic kidney disease, stage 3b: Secondary | ICD-10-CM | POA: Diagnosis not present

## 2023-07-12 DIAGNOSIS — E1129 Type 2 diabetes mellitus with other diabetic kidney complication: Secondary | ICD-10-CM | POA: Diagnosis not present

## 2023-07-19 ENCOUNTER — Other Ambulatory Visit (HOSPITAL_COMMUNITY): Payer: Self-pay | Admitting: Internal Medicine

## 2023-07-19 DIAGNOSIS — I10 Essential (primary) hypertension: Secondary | ICD-10-CM | POA: Diagnosis not present

## 2023-07-19 DIAGNOSIS — N63 Unspecified lump in unspecified breast: Secondary | ICD-10-CM

## 2023-07-19 DIAGNOSIS — E1122 Type 2 diabetes mellitus with diabetic chronic kidney disease: Secondary | ICD-10-CM | POA: Diagnosis not present

## 2023-07-19 DIAGNOSIS — N1831 Chronic kidney disease, stage 3a: Secondary | ICD-10-CM | POA: Diagnosis not present

## 2023-07-19 DIAGNOSIS — Z23 Encounter for immunization: Secondary | ICD-10-CM | POA: Diagnosis not present

## 2023-09-05 ENCOUNTER — Ambulatory Visit (HOSPITAL_COMMUNITY)
Admission: RE | Admit: 2023-09-05 | Discharge: 2023-09-05 | Disposition: A | Discharge status: Home / Self Care | Payer: Medicare Other | Source: Ambulatory Visit | Attending: Internal Medicine | Admitting: Internal Medicine

## 2023-09-05 ENCOUNTER — Encounter (HOSPITAL_COMMUNITY): Payer: Self-pay

## 2023-09-05 DIAGNOSIS — N63 Unspecified lump in unspecified breast: Secondary | ICD-10-CM

## 2023-09-05 DIAGNOSIS — N6315 Unspecified lump in the right breast, overlapping quadrants: Secondary | ICD-10-CM | POA: Insufficient documentation

## 2023-09-05 DIAGNOSIS — R92323 Mammographic fibroglandular density, bilateral breasts: Secondary | ICD-10-CM | POA: Diagnosis not present

## 2023-09-05 DIAGNOSIS — R928 Other abnormal and inconclusive findings on diagnostic imaging of breast: Secondary | ICD-10-CM | POA: Diagnosis not present

## 2023-09-25 ENCOUNTER — Other Ambulatory Visit: Payer: Self-pay | Admitting: Physician Assistant

## 2023-09-26 ENCOUNTER — Other Ambulatory Visit: Payer: Self-pay

## 2023-09-26 ENCOUNTER — Other Ambulatory Visit (HOSPITAL_COMMUNITY): Payer: Self-pay

## 2023-09-26 ENCOUNTER — Other Ambulatory Visit: Payer: Self-pay | Admitting: Physician Assistant

## 2023-09-26 MED ORDER — LOSARTAN POTASSIUM 100 MG PO TABS
100.0000 mg | ORAL_TABLET | Freq: Every day | ORAL | 3 refills | Status: DC
  Filled 2023-09-26: qty 90, 90d supply, fill #0

## 2023-09-26 NOTE — Telephone Encounter (Signed)
This Pt is a [place] Pt

## 2023-09-27 ENCOUNTER — Other Ambulatory Visit (HOSPITAL_COMMUNITY): Payer: Self-pay

## 2023-10-13 ENCOUNTER — Encounter (INDEPENDENT_AMBULATORY_CARE_PROVIDER_SITE_OTHER): Payer: Self-pay | Admitting: *Deleted

## 2023-10-18 DIAGNOSIS — E785 Hyperlipidemia, unspecified: Secondary | ICD-10-CM | POA: Diagnosis not present

## 2023-10-18 DIAGNOSIS — K760 Fatty (change of) liver, not elsewhere classified: Secondary | ICD-10-CM | POA: Diagnosis not present

## 2023-10-18 DIAGNOSIS — I10 Essential (primary) hypertension: Secondary | ICD-10-CM | POA: Diagnosis not present

## 2023-10-18 DIAGNOSIS — N1832 Chronic kidney disease, stage 3b: Secondary | ICD-10-CM | POA: Diagnosis not present

## 2023-10-18 DIAGNOSIS — E1129 Type 2 diabetes mellitus with other diabetic kidney complication: Secondary | ICD-10-CM | POA: Diagnosis not present

## 2023-10-23 DIAGNOSIS — E785 Hyperlipidemia, unspecified: Secondary | ICD-10-CM | POA: Diagnosis not present

## 2023-10-23 DIAGNOSIS — I480 Paroxysmal atrial fibrillation: Secondary | ICD-10-CM | POA: Diagnosis not present

## 2023-10-23 DIAGNOSIS — Z0001 Encounter for general adult medical examination with abnormal findings: Secondary | ICD-10-CM | POA: Diagnosis not present

## 2023-10-23 DIAGNOSIS — I10 Essential (primary) hypertension: Secondary | ICD-10-CM | POA: Diagnosis not present

## 2023-10-23 DIAGNOSIS — N1831 Chronic kidney disease, stage 3a: Secondary | ICD-10-CM | POA: Diagnosis not present

## 2023-10-23 DIAGNOSIS — E1129 Type 2 diabetes mellitus with other diabetic kidney complication: Secondary | ICD-10-CM | POA: Diagnosis not present

## 2023-10-27 DIAGNOSIS — Z1212 Encounter for screening for malignant neoplasm of rectum: Secondary | ICD-10-CM | POA: Diagnosis not present

## 2023-10-27 DIAGNOSIS — Z1211 Encounter for screening for malignant neoplasm of colon: Secondary | ICD-10-CM | POA: Diagnosis not present

## 2023-12-25 ENCOUNTER — Ambulatory Visit (INDEPENDENT_AMBULATORY_CARE_PROVIDER_SITE_OTHER): Payer: Medicare Other | Admitting: Dermatology

## 2023-12-25 DIAGNOSIS — D225 Melanocytic nevi of trunk: Secondary | ICD-10-CM

## 2023-12-25 DIAGNOSIS — Z1283 Encounter for screening for malignant neoplasm of skin: Secondary | ICD-10-CM | POA: Diagnosis not present

## 2023-12-25 DIAGNOSIS — W5503XA Scratched by cat, initial encounter: Secondary | ICD-10-CM | POA: Diagnosis not present

## 2023-12-25 DIAGNOSIS — Q800 Ichthyosis vulgaris: Secondary | ICD-10-CM

## 2023-12-25 DIAGNOSIS — L719 Rosacea, unspecified: Secondary | ICD-10-CM | POA: Diagnosis not present

## 2023-12-25 DIAGNOSIS — L853 Xerosis cutis: Secondary | ICD-10-CM

## 2023-12-25 DIAGNOSIS — S40811A Abrasion of right upper arm, initial encounter: Secondary | ICD-10-CM

## 2023-12-25 DIAGNOSIS — D1801 Hemangioma of skin and subcutaneous tissue: Secondary | ICD-10-CM

## 2023-12-25 DIAGNOSIS — Z872 Personal history of diseases of the skin and subcutaneous tissue: Secondary | ICD-10-CM

## 2023-12-25 DIAGNOSIS — W908XXA Exposure to other nonionizing radiation, initial encounter: Secondary | ICD-10-CM | POA: Diagnosis not present

## 2023-12-25 DIAGNOSIS — D2261 Melanocytic nevi of right upper limb, including shoulder: Secondary | ICD-10-CM

## 2023-12-25 DIAGNOSIS — L814 Other melanin hyperpigmentation: Secondary | ICD-10-CM | POA: Diagnosis not present

## 2023-12-25 DIAGNOSIS — L578 Other skin changes due to chronic exposure to nonionizing radiation: Secondary | ICD-10-CM | POA: Diagnosis not present

## 2023-12-25 DIAGNOSIS — D229 Melanocytic nevi, unspecified: Secondary | ICD-10-CM

## 2023-12-25 DIAGNOSIS — L821 Other seborrheic keratosis: Secondary | ICD-10-CM | POA: Diagnosis not present

## 2023-12-25 DIAGNOSIS — Z85828 Personal history of other malignant neoplasm of skin: Secondary | ICD-10-CM

## 2023-12-25 NOTE — Progress Notes (Signed)
 Follow-Up Visit   Subjective  Jennifer Morrison is a 78 y.o. female who presents for the following: Skin Cancer Screening and Full Body Skin Exam. Patient with history of BCC, Aks.  The patient presents for Total-Body Skin Exam (TBSE) for skin cancer screening and mole check. The patient has spots, moles and lesions to be evaluated, some may be new or changing and the patient may have concern these could be cancer.   The following portions of the chart were reviewed this encounter and updated as appropriate: medications, allergies, medical history  Review of Systems:  No other skin or systemic complaints except as noted in HPI or Assessment and Plan.  Objective  Well appearing patient in no apparent distress; mood and affect are within normal limits.  A full examination was performed including scalp, head, eyes, ears, nose, lips, neck, chest, axillae, abdomen, back, buttocks, bilateral upper extremities, bilateral lower extremities, hands, feet, fingers, toes, fingernails, and toenails. All findings within normal limits unless otherwise noted below.   Relevant physical exam findings are noted in the Assessment and Plan.  L popliteal 4mm dark gray brown waxy papule, Benign features under dermoscopy.  Back, legs Severe xerosis with focal areas of fish scale appearance at back, posterior arms, and thighs.  Assessment & Plan   SKIN CANCER SCREENING PERFORMED TODAY.  ACTINIC DAMAGE - Chronic condition, secondary to cumulative UV/sun exposure - diffuse scaly erythematous macules with underlying dyspigmentation - Recommend daily broad spectrum sunscreen SPF 30+ to sun-exposed areas, reapply every 2 hours as needed.  - Staying in the shade or wearing long sleeves, sun glasses (UVA+UVB protection) and wide brim hats (4-inch brim around the entire circumference of the hat) are also recommended for sun protection.  - Call for new or changing lesions.  LENTIGINES, SEBORRHEIC KERATOSES,  HEMANGIOMAS - Benign normal skin lesions - Benign-appearing - Call for any changes  MELANOCYTIC NEVI - Tan-brown and/or pink-flesh-colored symmetric macules and papules - 5mm flesh papule at R wrist (nevus vs neurofibroma) - 6mm x 3mm medium dark brown macule at R flank - Benign appearing on exam today - Observation - Call clinic for new or changing moles - Recommend daily use of broad spectrum spf 30+ sunscreen to sun-exposed areas.    History of Basal Cell Carcinoma of the Skin R nasal facial angle near eyelid - No evidence of recurrence today - Recommend regular full body skin exams - Recommend daily broad spectrum sunscreen SPF 30+ to sun-exposed areas, reapply every 2 hours as needed.  - Call if any new or changing lesions are noted between office visits  Xerosis - diffuse xerotic patches at face, back - recommend gentle, hydrating skin care - gentle skin care handout given  HEMANGIOMA Exam: red papule(s) Discussed benign nature. Recommend observation. Call for changes.   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 Condition is not bothersome/symptomatic for patient.  Rosacea is a chronic progressive skin condition usually affecting the face of adults, causing redness and/or acne bumps. It is treatable but not curable. It sometimes affects the eyes (ocular rosacea) as well. It may respond to topical and/or systemic medication and can flare with stress, sun exposure, alcohol, exercise, topical steroids (including hydrocortisone/cortisone 10) and some foods.  Daily application of broad spectrum spf 30+ sunscreen to face is recommended to reduce flares.  Treatment Plan: Mild, no treatment at this time Recommend moisturizer with sunscreen.  EXCORIATION- secondary to cat scratch from pt's cat Exam: Excoriation at R arm   Treatment Plan: Recommend vaseline daily until healed.  Call if not resolving.   SEBORRHEIC KERATOSIS L popliteal Reassured benign age-related growth.  Recommend observation.  Discussed cryotherapy if spot(s) become irritated or inflamed.  ICHTHYOSIS VULGARIS Back, legs Chronic and persistent genetic condition with duration or expected duration over one year. Condition is symptomatic/ bothersome to patient. Not currently at goal.   Pt states her father and two brothers have similar severe dry skin condition.  Recommend mild soap like Dove and AmLactin cream 15% BID, samples given.   Recommend starting moisturizer with exfoliant (Urea, Salicylic acid, or Lactic acid) one to two times daily to help smooth rough and bumpy skin.  OTC options include Cetaphil Rough and Bumpy lotion (Urea), Eucerin Roughness Relief lotion or spot treatment cream (Urea), CeraVe SA lotion/cream for Rough and Bumpy skin (Sal Acid), Gold Bond Rough and Bumpy cream (Sal Acid), and AmLactin 12% lotion/cream (Lactic Acid).  If applying in morning, also apply sunscreen to sun-exposed areas, since these exfoliating moisturizers can increase sensitivity to sun.   Return in about 1 year (around 12/24/2024) for TBSE, w/ Dr. Roseanne Reno, Hx BCC.  I, Soundra Pilon, CMA, am acting as scribe for Willeen Niece, MD .   Documentation: I have reviewed the above documentation for accuracy and completeness, and I agree with the above.  Willeen Niece, MD

## 2023-12-25 NOTE — Patient Instructions (Addendum)
 ICHTHYOSIS VULGARIS:  Recommend starting moisturizer with exfoliant (Urea, Salicylic acid, or Lactic acid) one to two times daily to help smooth rough and bumpy skin.  OTC options include Cetaphil Rough and Bumpy lotion (Urea), Eucerin Roughness Relief lotion or spot treatment cream (Urea), CeraVe SA lotion/cream for Rough and Bumpy skin (Sal Acid), Gold Bond Rough and Bumpy cream (Sal Acid), and AmLactin 12% lotion/cream (Lactic Acid).  If applying in morning, also apply sunscreen to sun-exposed areas, since these exfoliating moisturizers can increase sensitivity to sun.      Melanoma ABCDEs  Melanoma is the most dangerous type of skin cancer, and is the leading cause of death from skin disease.  You are more likely to develop melanoma if you: Have light-colored skin, light-colored eyes, or red or blond hair Spend a lot of time in the sun Tan regularly, either outdoors or in a tanning bed Have had blistering sunburns, especially during childhood Have a close family member who has had a melanoma Have atypical moles or large birthmarks  Early detection of melanoma is key since treatment is typically straightforward and cure rates are extremely high if we catch it early.   The first sign of melanoma is often a change in a mole or a new dark spot.  The ABCDE system is a way of remembering the signs of melanoma.  A for asymmetry:  The two halves do not match. B for border:  The edges of the growth are irregular. C for color:  A mixture of colors are present instead of an even brown color. D for diameter:  Melanomas are usually (but not always) greater than 6mm - the size of a pencil eraser. E for evolution:  The spot keeps changing in size, shape, and color.  Please check your skin once per month between visits. You can use a small mirror in front and a large mirror behind you to keep an eye on the back side or your body.   If you see any new or changing lesions before your next follow-up,  please call to schedule a visit.  Please continue daily skin protection including broad spectrum sunscreen SPF 30+ to sun-exposed areas, reapplying every 2 hours as needed when you're outdoors.   Staying in the shade or wearing long sleeves, sun glasses (UVA+UVB protection) and wide brim hats (4-inch brim around the entire circumference of the hat) are also recommended for sun protection.       Due to recent changes in healthcare laws, you may see results of your pathology and/or laboratory studies on MyChart before the doctors have had a chance to review them. We understand that in some cases there may be results that are confusing or concerning to you. Please understand that not all results are received at the same time and often the doctors may need to interpret multiple results in order to provide you with the best plan of care or course of treatment. Therefore, we ask that you please give [place] 2 business days to thoroughly review all your results before contacting the office for clarification. Should we see a critical lab result, you will be contacted sooner.   If You Need Anything After Your Visit  If you have any questions or concerns for your doctor, please call our main line at [phone] and press option 4 to reach your doctor's medical assistant. If no one answers, please leave a voicemail as directed and we will return your call as soon as possible. Messages left after 4  pm will be answered the following business day.   You may also send [place] a message via MyChart. We typically respond to MyChart messages within 1-2 business days.  For prescription refills, please ask your pharmacy to contact our office. Our fax number is [phone].  If you have an urgent issue when the clinic is closed that cannot wait until the next business day, you can page your doctor at the number below.    Please note that while we do our best to be available for urgent issues outside of office hours, we are  not available 24/7.   If you have an urgent issue and are unable to reach [place], you may choose to seek medical care at your doctor's office, retail clinic, urgent care center, or emergency room.  If you have a medical emergency, please immediately call 911 or go to the emergency department.  Pager Numbers  - Dr. Gwen Pounds: [phone]  - Dr. Roseanne Reno: [phone]  - Dr. Katrinka Blazing: [phone]   In the event of inclement weather, please call our main line at [phone] for an update on the status of any delays or closures.  Dermatology Medication Tips: Please keep the boxes that topical medications come in in order to help keep track of the instructions about where and how to use these. Pharmacies typically print the medication instructions only on the boxes and not directly on the medication tubes.   If your medication is too expensive, please contact our office at [phone] option 4 or send [place] a message through MyChart.   We are unable to tell what your co-pay for medications will be in advance as this is different depending on your insurance coverage. However, we may be able to find a substitute medication at lower cost or fill out paperwork to get insurance to cover a needed medication.   If a prior authorization is required to get your medication covered by your insurance company, please allow [place] 1-2 business days to complete this process.  Drug prices often vary depending on where the prescription is filled and some pharmacies may offer cheaper prices.  The website www.goodrx.com contains coupons for medications through different pharmacies. The prices here do not account for what the cost may be with help from insurance (it may be cheaper with your insurance), but the website can give you the price if you did not use any insurance.  - You can print the associated coupon and take it with your prescription to the pharmacy.  - You may also stop by our office during regular business  hours and pick up a GoodRx coupon card.  - If you need your prescription sent electronically to a different pharmacy, notify our office through [facility] or by phone at [phone] option 4.     Si Usted Necesita Algo Despus de Su Visita  Tambin puede enviarnos un mensaje a travs de Clinical cytogeneticist. Por lo general respondemos a los mensajes de MyChart en el transcurso de 1 a 2 das hbiles.  Para renovar recetas, por favor pida a su farmacia que se ponga en contacto con nuestra oficina. Annie Sable de fax es [place] [phone].  Si tiene un asunto urgente cuando la clnica est cerrada y que no puede esperar hasta el siguiente da hbil, puede llamar/localizar a su doctor(a) al nmero que aparece a continuacin.   Por favor, tenga en cuenta que aunque hacemos todo lo posible para estar disponibles para asuntos urgentes fuera del horario de oficina, no estamos disponibles las  24 horas del Futures trader, los 7 [address]  [address] de la [place].   Si tiene un problema urgente y no puede comunicarse con nosotros, puede optar por buscar atencin mdica  en el consultorio de su doctor(a), en una clnica privada, en un centro de atencin urgente o en una sala de emergencias.  Si tiene Engineer, drilling, por favor llame inmediatamente al 911 o vaya a la sala de emergencias.  Nmeros de bper  - Dr. Gwen Pounds: [phone]  - Dra. Roseanne Reno: [phone]  - Dr. Katrinka Blazing: [phone]   En caso de inclemencias del tiempo, por favor llame a Lacy Duverney principal al [phone] para una actualizacin sobre el [place] de cualquier retraso o cierre.  Consejos para la medicacin en dermatologa: Por favor, guarde las cajas en las que vienen los medicamentos de uso tpico para ayudarle a seguir las instrucciones sobre dnde y cmo usarlos. Las farmacias generalmente imprimen las instrucciones del medicamento slo en las cajas y no directamente en los tubos del [place].   Si su medicamento es muy caro, por favor,  pngase en contacto con Rolm Gala llamando al [phone] y presione la opcin 4 o envenos un mensaje a travs de Clinical cytogeneticist.   No podemos decirle cul ser su copago por los medicamentos por adelantado ya que esto es diferente dependiendo de la cobertura de su seguro. Sin embargo, es posible que podamos encontrar un medicamento sustituto a Audiological scientist un formulario para que el seguro cubra el medicamento que se considera necesario.   Si se requiere una autorizacin previa para que su compaa de seguros [place] su medicamento, por favor permtanos de 1 a 2 das hbiles para completar [address]  Los precios de los medicamentos varan con frecuencia dependiendo del Environmental consultant de dnde se surte la receta y alguna farmacias pueden ofrecer precios ms baratos.  El sitio web www.goodrx.com tiene cupones para medicamentos de Health and safety inspector. Los precios aqu no tienen en cuenta lo que podra costar con la ayuda del seguro (puede ser ms barato con su seguro), pero el sitio web puede darle el precio si no utiliz Tourist information centre manager.  - Puede imprimir el cupn correspondiente y llevarlo con su receta a la farmacia.  - Tambin puede pasar por nuestra oficina durante el horario de atencin regular y Education officer, museum una tarjeta de cupones de GoodRx.  - Si necesita que su receta se enve electrnicamente a una farmacia diferente, informe a nuestra oficina a travs de MyChart de [place] o por telfono llamando al [phone] y presione la opcin 4.

## 2024-01-16 ENCOUNTER — Other Ambulatory Visit: Payer: Self-pay | Admitting: Internal Medicine

## 2024-01-17 DIAGNOSIS — N1832 Chronic kidney disease, stage 3b: Secondary | ICD-10-CM | POA: Diagnosis not present

## 2024-01-17 DIAGNOSIS — E785 Hyperlipidemia, unspecified: Secondary | ICD-10-CM | POA: Diagnosis not present

## 2024-01-17 DIAGNOSIS — K760 Fatty (change of) liver, not elsewhere classified: Secondary | ICD-10-CM | POA: Diagnosis not present

## 2024-01-17 DIAGNOSIS — E1129 Type 2 diabetes mellitus with other diabetic kidney complication: Secondary | ICD-10-CM | POA: Diagnosis not present

## 2024-01-17 DIAGNOSIS — Z79899 Other long term (current) drug therapy: Secondary | ICD-10-CM | POA: Diagnosis not present

## 2024-01-22 ENCOUNTER — Telehealth: Payer: Self-pay | Admitting: Internal Medicine

## 2024-01-22 DIAGNOSIS — R55 Syncope and collapse: Secondary | ICD-10-CM

## 2024-01-22 NOTE — Telephone Encounter (Signed)
 Spoke to patient who verbalized understanding. Patient will come to [facility] office on 4/15 at 4 pm for monitor placement.

## 2024-01-22 NOTE — Telephone Encounter (Signed)
 Patient c/o Palpitations:  STAT if patient reporting lightheadedness, shortness of breath, or chest pain  How long have you had palpitations/irregular HR/ Afib? Are you having the symptoms now?   No  Are you currently experiencing lightheadedness, SOB or CP?   A little lightheadedness  Do you have a history of afib (atrial fibrillation) or irregular heart rhythm?   Yes  Have you checked your BP or HR? (document readings if available):   BP 149/76  HR 60  at 8:09 am   141/72  HR 65  Are you experiencing any other symptoms?   Weakness   Patient noted she had afib and high BP readings on Saturday morning around 8:00 am as her BP got as high as 200/120.  Patient wants advice on next steps.

## 2024-01-23 ENCOUNTER — Ambulatory Visit: Attending: Internal Medicine

## 2024-01-23 ENCOUNTER — Other Ambulatory Visit: Payer: Self-pay

## 2024-01-23 DIAGNOSIS — R55 Syncope and collapse: Secondary | ICD-10-CM

## 2024-01-23 NOTE — Addendum Note (Signed)
 Addended by: Casper Clement on: 01/23/2024 03:49 PM   Modules accepted: Orders

## 2024-01-24 DIAGNOSIS — I480 Paroxysmal atrial fibrillation: Secondary | ICD-10-CM | POA: Diagnosis not present

## 2024-01-24 DIAGNOSIS — E1129 Type 2 diabetes mellitus with other diabetic kidney complication: Secondary | ICD-10-CM | POA: Diagnosis not present

## 2024-01-24 DIAGNOSIS — N1831 Chronic kidney disease, stage 3a: Secondary | ICD-10-CM | POA: Diagnosis not present

## 2024-02-12 DIAGNOSIS — R55 Syncope and collapse: Secondary | ICD-10-CM | POA: Diagnosis not present

## 2024-02-21 ENCOUNTER — Telehealth: Payer: Self-pay | Admitting: Internal Medicine

## 2024-02-21 NOTE — Telephone Encounter (Signed)
 Patient notified via My Chart

## 2024-02-21 NOTE — Telephone Encounter (Signed)
Patient is requesting a call back to discuss monitor results.

## 2024-04-19 DIAGNOSIS — R55 Syncope and collapse: Secondary | ICD-10-CM | POA: Diagnosis not present

## 2024-04-24 ENCOUNTER — Ambulatory Visit: Payer: Self-pay | Admitting: Internal Medicine

## 2024-04-24 DIAGNOSIS — K760 Fatty (change of) liver, not elsewhere classified: Secondary | ICD-10-CM | POA: Diagnosis not present

## 2024-04-24 DIAGNOSIS — E1129 Type 2 diabetes mellitus with other diabetic kidney complication: Secondary | ICD-10-CM | POA: Diagnosis not present

## 2024-04-24 DIAGNOSIS — E785 Hyperlipidemia, unspecified: Secondary | ICD-10-CM | POA: Diagnosis not present

## 2024-04-24 DIAGNOSIS — Z79899 Other long term (current) drug therapy: Secondary | ICD-10-CM | POA: Diagnosis not present

## 2024-04-24 DIAGNOSIS — N1832 Chronic kidney disease, stage 3b: Secondary | ICD-10-CM | POA: Diagnosis not present

## 2024-05-01 DIAGNOSIS — I10 Essential (primary) hypertension: Secondary | ICD-10-CM | POA: Diagnosis not present

## 2024-05-01 DIAGNOSIS — E1129 Type 2 diabetes mellitus with other diabetic kidney complication: Secondary | ICD-10-CM | POA: Diagnosis not present

## 2024-05-01 DIAGNOSIS — N1832 Chronic kidney disease, stage 3b: Secondary | ICD-10-CM | POA: Diagnosis not present

## 2024-05-01 DIAGNOSIS — I480 Paroxysmal atrial fibrillation: Secondary | ICD-10-CM | POA: Diagnosis not present

## 2024-06-07 ENCOUNTER — Ambulatory Visit: Attending: Internal Medicine | Admitting: Internal Medicine

## 2024-06-07 VITALS — BP 166/68 | HR 62 | Wt 180.4 lb

## 2024-06-07 DIAGNOSIS — G4733 Obstructive sleep apnea (adult) (pediatric): Secondary | ICD-10-CM | POA: Diagnosis not present

## 2024-06-07 DIAGNOSIS — R42 Dizziness and giddiness: Secondary | ICD-10-CM | POA: Diagnosis not present

## 2024-06-07 DIAGNOSIS — I5030 Unspecified diastolic (congestive) heart failure: Secondary | ICD-10-CM

## 2024-06-07 MED ORDER — AMLODIPINE BESYLATE 5 MG PO TABS: 5.0000 mg | ORAL_TABLET | Freq: Every day | ORAL | 3 refills | Status: AC

## 2024-06-07 NOTE — Progress Notes (Signed)
 Cardiology Office Note  Date: 06/07/2024   ID: Jennifer Morrison, Jennifer Morrison 1945/11/30, MRN [id number]  PCP:  Sheryle Carwin, MD  Cardiologist:  Diannah SHAUNNA Maywood, MD Electrophysiologist:  None   Reason for Office Visit: Follow-up of A-fib   History of Present Illness: Jennifer Morrison is a 78 y.o. female known to have paroxysmal A-fib on [facility], HTN, DM2, HLD presented to the cardiology clinic for follow-up visit.  Patient was admitted at [facility] in 03/2022 for recurrent A-fib with RVR (previously had ER visit in 02/2022 with A-fib and spontaneously converted to NSR) and was discharged on Cardizem , metoprolol  and Eliquis . Plan was initially to schedule for outpatient DCCV but she spontaneously converted to NSR during the preoperative anesthesia appointment.  During one of the APP's clinic appointments, patient was noted to have HR 30s with fatigue due to which she underwent event monitor with 12 SVT episodes and average heart rate 89 bpm for which she was referred to EP with Dr. Waddell on 05/2022. Dr. Waddell recommended no indication for ablation or PPM placement and continued current medical therapy.  After decreasing the dose of diltiazem  from 2040 mg to 180 mg once daily, heart rates improved.  EKG today showed NSR, HR 62 bpm.  She presented today for follow-up visit.    She reports having fatigue with overexertion.  No angina or DOE.  She has chronic stable dizziness.  Dizziness lasts for hours sometimes.  I reviewed event monitor performed in 2023 and 2025 with the patient today. Nonsustained SVT episodes occurred but no malignant arrhythmias or conduction abnormalities noted. No syncope, leg swelling or palpitations. She does notice elevated heart rates on her smart watch but has no symptoms with it. Her heart rates appear to be appropriately elevated with exercise at this time.  No concern for chronotropic incompetence at this time.  I reviewed her home BP log that showed elevated blood pressures 140 to  150 mmHg SBP in the a.m. and p.m.   Past Medical History:  Diagnosis Date   Actinic keratosis 08/23/2018   L lat calf - bx proven   Actinic keratosis 06/24/2021   R med thigh - bx proven   Basal cell carcinoma 09/12/2019   R nasal facial angle near eyelid   Diabetes mellitus without complication (HCC)    NIDDM x 1 yr   GERD (gastroesophageal reflux disease)    Hypercholesteremia    Hypertension    PAF (paroxysmal atrial fibrillation) (HCC)    Uterine cancer (HCC) 08/14/2016    Past Surgical History:  Procedure Laterality Date   ABDOMINAL HYSTERECTOMY     CATARACT EXTRACTION W/PHACO Right 06/04/2018   Procedure: CATARACT EXTRACTION PHACO AND INTRAOCULAR LENS PLACEMENT RIGHT EYE CDE=7.64;  Surgeon: Perley Hamilton, MD;  Location: [facility];  Service: Ophthalmology;  Laterality: Right;  right   CATARACT EXTRACTION W/PHACO Left 07/09/2018   Procedure: CATARACT EXTRACTION PHACO AND INTRAOCULAR LENS PLACEMENT (IOC);  Surgeon: Perley Hamilton, MD;  Location: [facility];  Service: Ophthalmology;  Laterality: Left;  CDE: 5.53   COLONOSCOPY  2005   COLONOSCOPY N/A 11/28/2013   Procedure: COLONOSCOPY;  Surgeon: Claudis RAYMOND Rivet, MD;  Location: [facility] SUITE;  Service: Endoscopy;  Laterality: N/A;  830   Polyp removed from uterus     ROBOTIC ASSISTED LAP VAGINAL HYSTERECTOMY N/A 09/06/2016   Procedure: XI ROBOTIC ASSISTED LAPAROSCOPIC TOTAL HYSTERECTOMY;  Surgeon: Maurilio Ship, MD;  Location: [facility];  Service: Gynecology;  Laterality: N/A;   ROBOTIC ASSISTED SALPINGO  OOPHERECTOMY Bilateral 09/06/2016   Procedure: XI ROBOTIC ASSISTED SALPINGO OOPHORECTOMY;  Surgeon: Maurilio Ship, MD;  Location: [facility];  Service: Gynecology;  Laterality: Bilateral;   SENTINEL NODE BIOPSY N/A 09/06/2016   Procedure: SENTINEL NODE BIOPSY;  Surgeon: Maurilio Ship, MD;  Location: [facility];  Service: Gynecology;  Laterality: N/A;    Current Outpatient Medications  Medication Sig Dispense Refill   carvedilol  (COREG ) 3.125 MG tablet TAKE 1  TABLET(3.125 MG) BY MOUTH TWICE DAILY 180 tablet 2   diltiazem  (CARDIZEM  CD) 180 MG 24 hr capsule Take 1 capsule (180 mg total) by mouth daily. 90 capsule 2   ELIQUIS  5 MG TABS tablet TAKE 1 TABLET(5 MG) BY MOUTH TWICE DAILY 180 tablet 3   losartan  (COZAAR ) 100 MG tablet TAKE 1 TABLET(100 MG) BY MOUTH DAILY 90 tablet 1   metFORMIN (GLUCOPHAGE) 500 MG tablet Take 500 mg by mouth 2 (two) times daily.      Multiple Vitamin (MULTIVITAMIN) tablet Take 1 tablet by mouth daily.     omeprazole (PRILOSEC) 20 MG capsule Take 20 mg by mouth daily.     pravastatin  (PRAVACHOL ) 20 MG tablet Take 20 mg by mouth every evening.      chlorthalidone  (HYGROTON ) 25 MG tablet Take 1 tablet (25 mg total) by mouth daily. 90 tablet 3   JARDIANCE 10 MG TABS tablet Take 10 mg by mouth daily.     No current facility-administered medications for this visit.   Allergies:  Patient has no known allergies.   Social History: The patient  reports that she has never smoked. She has never used smokeless tobacco. She reports that she does not drink alcohol and does not use drugs.   Family History: The patient's family history includes Diabetes in her brother and father.   ROS:  Please see the history of present illness. Otherwise, complete review of systems is positive for none.  All other systems are reviewed and negative.   Physical Exam: VS:  BP (!) 166/68   Pulse 62   Wt 180 lb 6.4 oz (81.8 kg)   SpO2 97%   BMI 33.00 kg/m , BMI Body mass index is 33 kg/m.  Wt Readings from Last 3 Encounters:  06/07/24 180 lb 6.4 oz (81.8 kg)  04/28/23 183 lb 9.6 oz (83.3 kg)  10/27/22 182 lb (82.6 kg)    General: Patient appears comfortable at rest. HEENT: Conjunctiva and lids normal, oropharynx clear with moist mucosa. Neck: Supple, no elevated JVP or carotid bruits, no thyromegaly. Lungs: Clear to auscultation, nonlabored breathing at rest. Cardiac: Regular rate and rhythm, no S3 or significant systolic murmur, no pericardial  rub. Abdomen: Soft, nontender, no hepatomegaly, bowel sounds present, no guarding or rebound. Extremities: No pitting edema, distal pulses 2+. Skin: Warm and dry. Musculoskeletal: No kyphosis. Neuropsychiatric: Alert and oriented x3, affect grossly appropriate.  ECG:  An ECG dated 04/2022 was personally reviewed today and demonstrated:  Normal sinus rhythm  Recent Labwork: No results found for requested labs within last 365 days.  No results found for: CHOL, TRIG, HDL, CHOLHDL, VLDL, LDLCALC, LDLDIRECT  Other Studies Reviewed Today:   Assessment and Plan:  # Dizziness - I reviewed event monitor findings with the patient.  Event monitor was performed in 2023 and 2025, both unremarkable except for nonsustained SVT episodes.  Patient has been asymptomatic with these.  Dizziness lasts for hours symptoms.  Unlikely cardiac.  She will need workup for noncardiac causes of dizziness.  # Paroxysmal A-fib - EKG today  showed NSR, HR 62 bpm - Switch diltiazem  to amlodipine  for HTN control. - Continue carvedilol  3.15 mg twice daily. - Continue Eliquis  5 mg twice daily. - Obtain home sleep study.  # HTN, poorly controlled - Home BP log reviewed.  Blood pressures elevated, 140 to 155 mmHg SBP in the a.m. and p.m. - Switch diltiazem  to amlodipine  5 mg daily at bedtime for adequate HTN control. - Continue remaining antihypertensives, chlorthalidone  25 mg once daily, carvedilol  3.125 mg twice daily, losartan  100 mg once daily.  # Type 2 DM - Continue metformin 500 mg twice daily.  Previously on Jardiance, not anymore.  Follows with PCP.  # HLD, unknown values - Continue pravastatin  20 mg nightly.  Follows with PCP.  Goal LDL less than 100.  I have spent a total of 30 minutes with patient reviewing chart, EKGs, labs and examining patient as well as establishing an assessment and plan that was discussed with the patient.  > 50% of time was spent in direct patient care.   '  Medication Adjustments/Labs and Tests Ordered: Current medicines are reviewed at length with the patient today.  Concerns regarding medicines are outlined above.   Tests Ordered: Orders Placed This Encounter  Procedures   EKG 12-Lead    Medication Changes: No orders of the defined types were placed in this encounter.   Disposition:  Follow up 1 year  Signed, Marshella Tello Arleta Maywood, MD, 06/07/2024 10:09 AM    [facility] [address], [place], [place] [address]

## 2024-06-07 NOTE — Patient Instructions (Signed)
 Medication Instructions:   STOP Diltiazem      START Amlodipine  5 mg daily   Let us  know if you have elevated heart rates   Labwork: None today  Testing/Procedures: [place] home sleep study. [facility] office will call you to schedule      Follow-Up: 1 year  Any Other Special Instructions Will Be Listed Below (If Applicable).  If you need a refill on your cardiac medications before your next appointment, please call your pharmacy.

## 2024-07-04 DIAGNOSIS — E119 Type 2 diabetes mellitus without complications: Secondary | ICD-10-CM | POA: Diagnosis not present

## 2024-07-05 DIAGNOSIS — E1129 Type 2 diabetes mellitus with other diabetic kidney complication: Secondary | ICD-10-CM | POA: Diagnosis not present

## 2024-07-22 ENCOUNTER — Other Ambulatory Visit (HOSPITAL_COMMUNITY): Payer: Self-pay | Admitting: Internal Medicine

## 2024-07-22 DIAGNOSIS — Z1231 Encounter for screening mammogram for malignant neoplasm of breast: Secondary | ICD-10-CM

## 2024-07-31 DIAGNOSIS — E1129 Type 2 diabetes mellitus with other diabetic kidney complication: Secondary | ICD-10-CM | POA: Diagnosis not present

## 2024-07-31 DIAGNOSIS — N1832 Chronic kidney disease, stage 3b: Secondary | ICD-10-CM | POA: Diagnosis not present

## 2024-07-31 DIAGNOSIS — Z79899 Other long term (current) drug therapy: Secondary | ICD-10-CM | POA: Diagnosis not present

## 2024-07-31 DIAGNOSIS — K760 Fatty (change of) liver, not elsewhere classified: Secondary | ICD-10-CM | POA: Diagnosis not present

## 2024-07-31 DIAGNOSIS — E785 Hyperlipidemia, unspecified: Secondary | ICD-10-CM | POA: Diagnosis not present

## 2024-08-07 ENCOUNTER — Encounter (HOSPITAL_BASED_OUTPATIENT_CLINIC_OR_DEPARTMENT_OTHER): Payer: Self-pay | Admitting: Cardiology

## 2024-08-07 DIAGNOSIS — I480 Paroxysmal atrial fibrillation: Secondary | ICD-10-CM | POA: Diagnosis not present

## 2024-08-07 DIAGNOSIS — R0683 Snoring: Secondary | ICD-10-CM | POA: Diagnosis not present

## 2024-08-09 ENCOUNTER — Ambulatory Visit: Attending: Internal Medicine

## 2024-08-09 DIAGNOSIS — G4733 Obstructive sleep apnea (adult) (pediatric): Secondary | ICD-10-CM

## 2024-08-09 NOTE — Procedures (Signed)
     Sleep Study Report Patient Information Name: Jennifer Morrison  ID: [id number] Birth Date: 15-Jul-1946  Age: 78  Gender: Female BMI: 33.3 (W=181 lb, H=5' 2'') Study Date:08/07/2024 Referring Physician: Vishnu Mallipeddi, MD  TEST DESCRIPTION: Home sleep apnea testing was completed using the WatchPat, a Type 1 device, utilizing peripheral arterial tonometry (PAT), chest movement, actigraphy, pulse oximetry, pulse rate, body position and snore. AHI was calculated with apnea and hypopnea using valid sleep time as the denominator. RDI includes apneas, hypopneas, and RERAs. The data acquired and the scoring of sleep and all associated events were performed in accordance with the recommended standards and specifications as outlined in the AASM Manual for the Scoring of Sleep and Associated Events 2.2.0 (2015).  FINDINGS: 1. No evidence of Obstructive Sleep Apnea with AHI 4.2/hr. 2. No Central Sleep Apnea. 3. Oxygen desaturations as low as 79%. 4. Mild to moderate snoring was present. O2 sats were < 88% for 1.7 minutes. 5. Total sleep time was 8 hrs and 21 min. 6. 16.3% of total sleep time was spent in REM sleep. 7. Shortened sleep onset latency at 6 min. 8. Shortened REM sleep onset latency at 58 min. 9. Total awakenings were 6.  DIAGNOSIS: Normal study with no significant sleep disordered breathing.  Recommendations 1. Normal study with no significant sleep disordered breathing. 2. Healthy sleep recommendations include: adequate nightly sleep (normal 7-9 hrs/night), avoidance of caffeine after noon and alcohol near bedtime, and maintaining a sleep environment that is cool, dark and quiet. 3. Weight loss for overweight patients is recommended. 4. Snoring recommendations include: weight loss where appropriate, side sleeping, and avoidance of alcohol before bed. 5. Operation of motor vehicle or dangerous equipment must be avoided when feeling drowsy, excessively sleepy, or mentally  fatigued. 6. An ENT consultation which may be useful for specific causes of and possible treatment of bothersome snoring . 7. Weight loss may be of benefit in reducing the severity of snoring.    Report prepared by: Signature: Electronically Signed: Wilbert Bihari 08/09/2024 10:00:04 PM

## 2024-08-21 ENCOUNTER — Telehealth: Payer: Self-pay | Admitting: *Deleted

## 2024-08-21 NOTE — Telephone Encounter (Signed)
 The patient has been notified of the result via her mychart.

## 2024-08-21 NOTE — Telephone Encounter (Signed)
-----   Message from Wilbert Bihari sent at 08/09/2024 10:04 PM EDT ----- Please let patient know that sleep study showed no significant sleep apnea.

## 2024-09-09 ENCOUNTER — Ambulatory Visit (HOSPITAL_COMMUNITY)
Admission: RE | Admit: 2024-09-09 | Discharge: 2024-09-09 | Disposition: A | Source: Ambulatory Visit | Attending: Internal Medicine | Admitting: Internal Medicine

## 2024-09-09 DIAGNOSIS — Z1231 Encounter for screening mammogram for malignant neoplasm of breast: Secondary | ICD-10-CM | POA: Insufficient documentation

## 2024-10-15 ENCOUNTER — Ambulatory Visit: Payer: Self-pay | Admitting: Internal Medicine

## 2024-12-31 ENCOUNTER — Ambulatory Visit: Admitting: Dermatology
